# Patient Record
Sex: Female | Born: 1950 | Race: White | Hispanic: No | Marital: Married | Smoking: Never smoker
Health system: Southern US, Community
[De-identification: ages and names within clinical notes are randomized; demographics above are authoritative.]

## PROBLEM LIST (undated history)

## (undated) DIAGNOSIS — F329 Major depressive disorder, single episode, unspecified: Secondary | ICD-10-CM

## (undated) DIAGNOSIS — D219 Benign neoplasm of connective and other soft tissue, unspecified: Secondary | ICD-10-CM

## (undated) DIAGNOSIS — F32A Depression, unspecified: Secondary | ICD-10-CM

## (undated) DIAGNOSIS — M8580 Other specified disorders of bone density and structure, unspecified site: Secondary | ICD-10-CM

## (undated) HISTORY — PX: ENDOMETRIAL ABLATION: SHX621

## (undated) HISTORY — PX: ABDOMINAL HYSTERECTOMY: SHX81

## (undated) HISTORY — PX: TONSILLECTOMY AND ADENOIDECTOMY: SHX28

## (undated) HISTORY — DX: Other specified disorders of bone density and structure, unspecified site: M85.80

## (undated) HISTORY — DX: Major depressive disorder, single episode, unspecified: F32.9

## (undated) HISTORY — DX: Benign neoplasm of connective and other soft tissue, unspecified: D21.9

## (undated) HISTORY — DX: Depression, unspecified: F32.A

---

## 2006-08-21 ENCOUNTER — Ambulatory Visit: Payer: Self-pay | Admitting: Gastroenterology

## 2007-05-14 ENCOUNTER — Emergency Department: Payer: Self-pay | Admitting: Emergency Medicine

## 2007-05-17 ENCOUNTER — Ambulatory Visit: Payer: Self-pay

## 2009-09-30 ENCOUNTER — Other Ambulatory Visit: Admission: RE | Admit: 2009-09-30 | Discharge: 2009-09-30 | Payer: Self-pay | Admitting: Gynecology

## 2009-09-30 ENCOUNTER — Ambulatory Visit: Payer: Self-pay | Admitting: Gynecology

## 2009-10-19 ENCOUNTER — Encounter: Admission: RE | Admit: 2009-10-19 | Discharge: 2009-10-19 | Payer: Self-pay | Admitting: Gynecology

## 2009-10-19 ENCOUNTER — Ambulatory Visit: Payer: Self-pay | Admitting: Gynecology

## 2009-10-28 ENCOUNTER — Encounter: Admission: RE | Admit: 2009-10-28 | Discharge: 2009-10-28 | Payer: Self-pay | Admitting: Gynecology

## 2009-12-02 ENCOUNTER — Ambulatory Visit: Payer: Self-pay | Admitting: Gynecology

## 2009-12-11 ENCOUNTER — Ambulatory Visit: Payer: Self-pay | Admitting: Gynecology

## 2009-12-18 ENCOUNTER — Ambulatory Visit: Payer: Self-pay | Admitting: Gynecology

## 2010-02-10 ENCOUNTER — Ambulatory Visit: Payer: Self-pay | Admitting: Gynecology

## 2010-03-16 ENCOUNTER — Ambulatory Visit: Payer: Self-pay | Admitting: Gynecology

## 2010-05-10 ENCOUNTER — Encounter: Admission: RE | Admit: 2010-05-10 | Discharge: 2010-05-10 | Payer: Self-pay | Admitting: Gynecology

## 2010-10-03 IMAGING — MG MM DIGITAL SCREENING BILAT W/ CAD
4 series · 4 of 4 positions shown · non-contrast
Comparison: none

DG SCREEN MAMMOGRAM BILATERAL
Bilateral CC and MLO view(s) were taken.

DIGITAL SCREENING MAMMOGRAM WITH CAD:
There are scattered fibroglandular densities.  A possible mass is noted in the right breast.  Spot 
compression views and possibly sonography are recommended for further evaluation.  In the left 
breast, no masses or malignant type calcifications are identified.  Compared with prior studies 
from [REDACTED] dated 10-06-08, 01-08-08 and 01-04-06.
Images were processed with CAD.

[R CC]
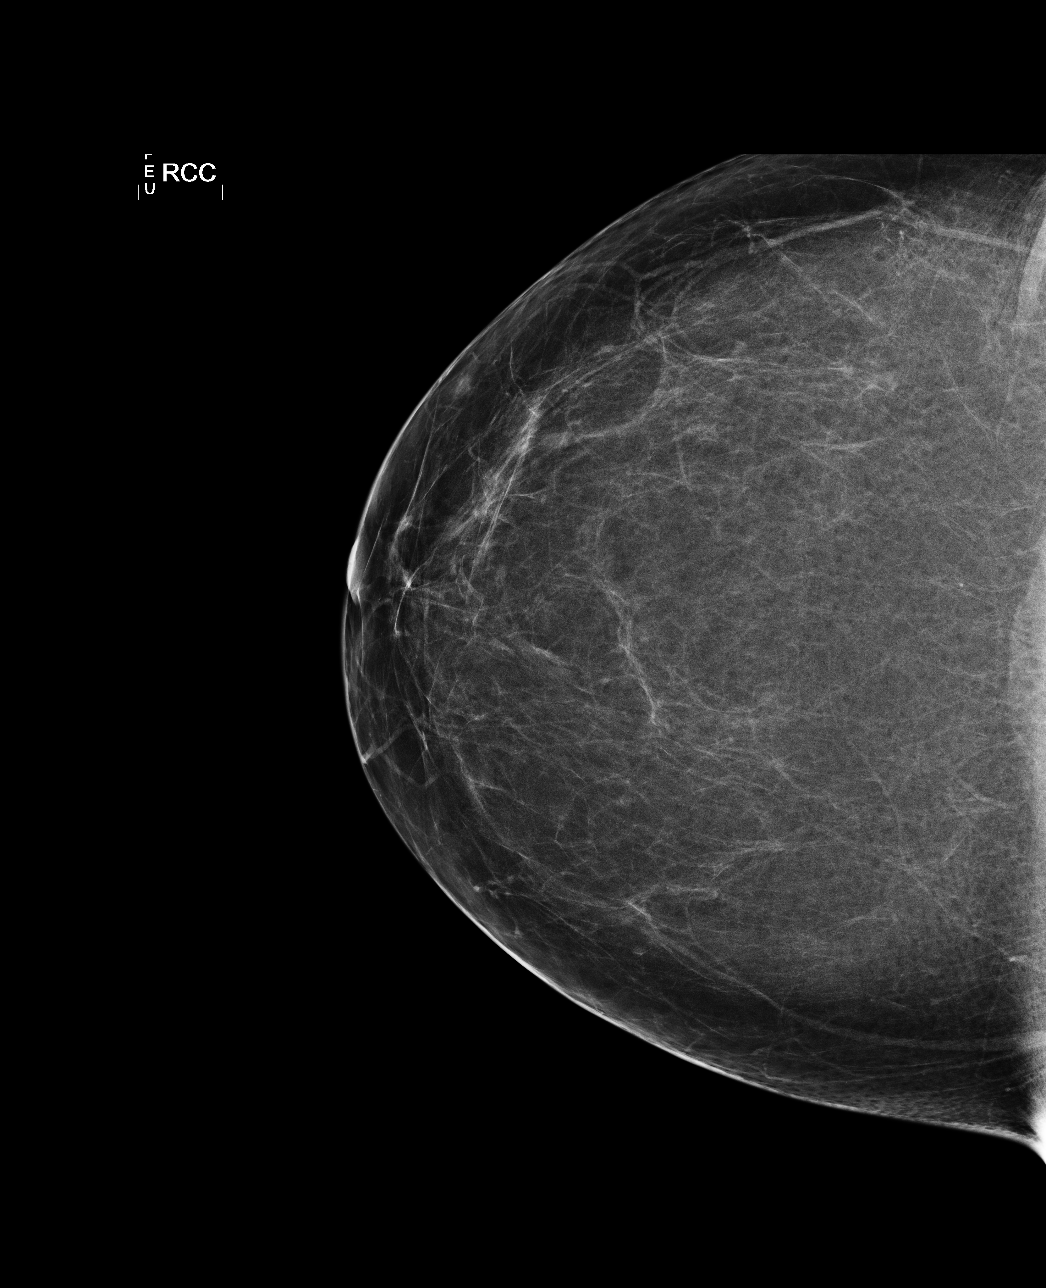

[L CC]
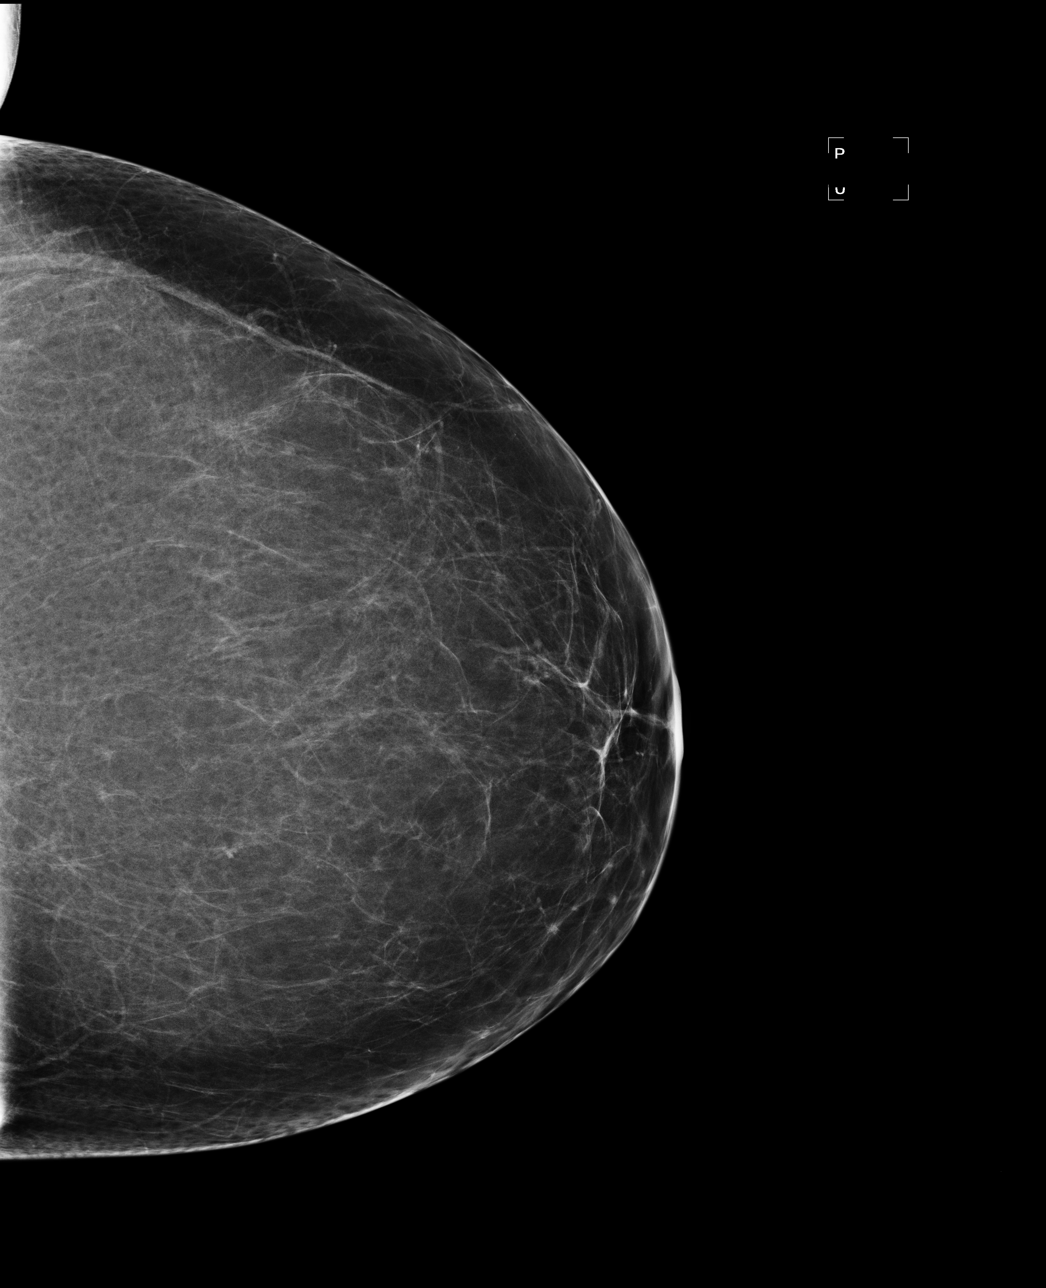

[L MLO]
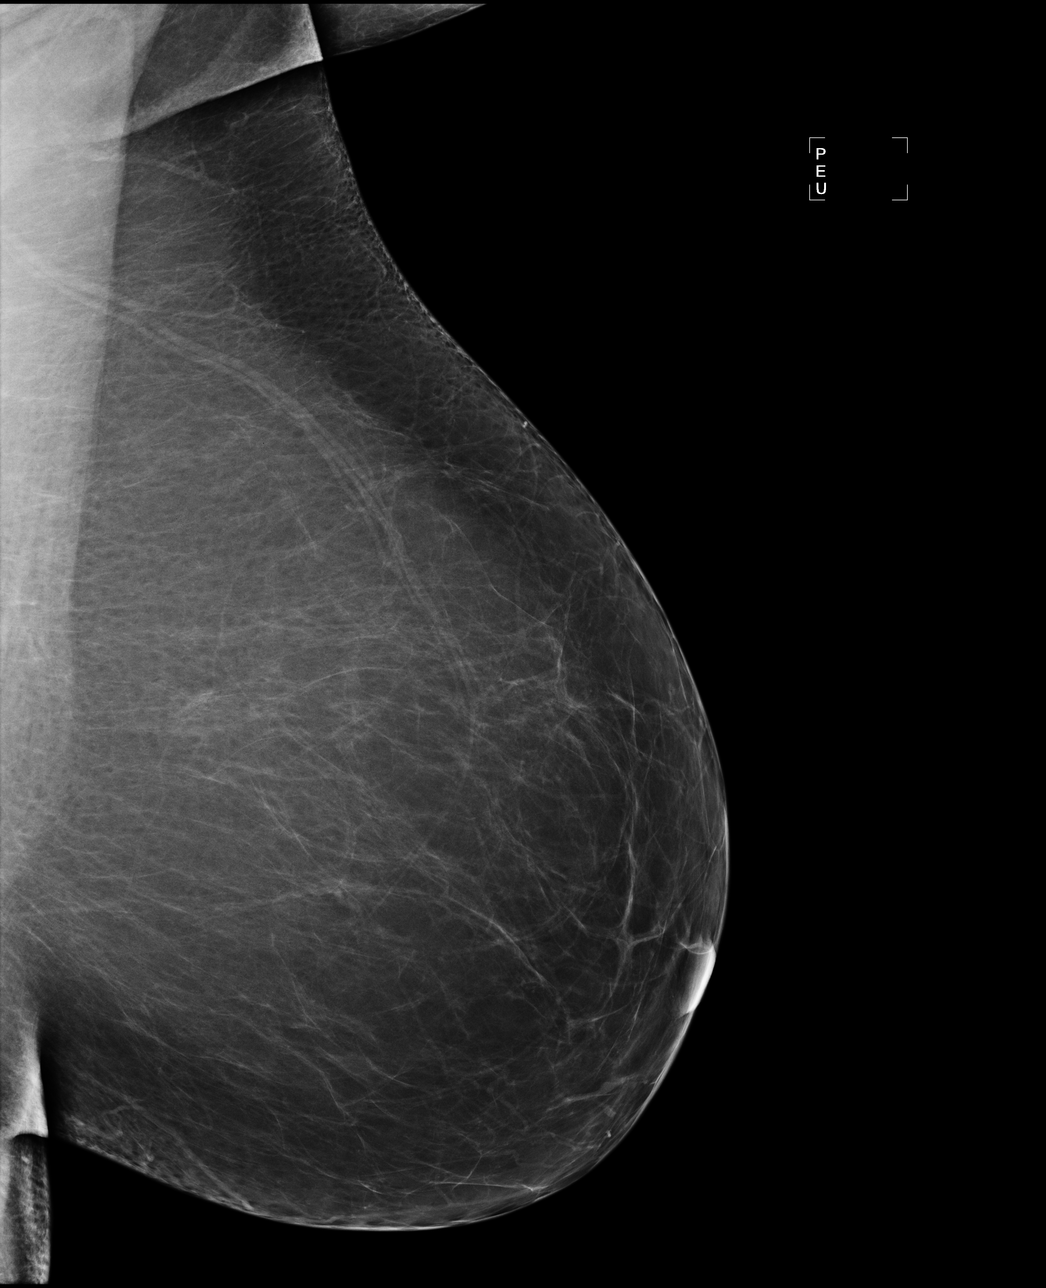

[R MLO]
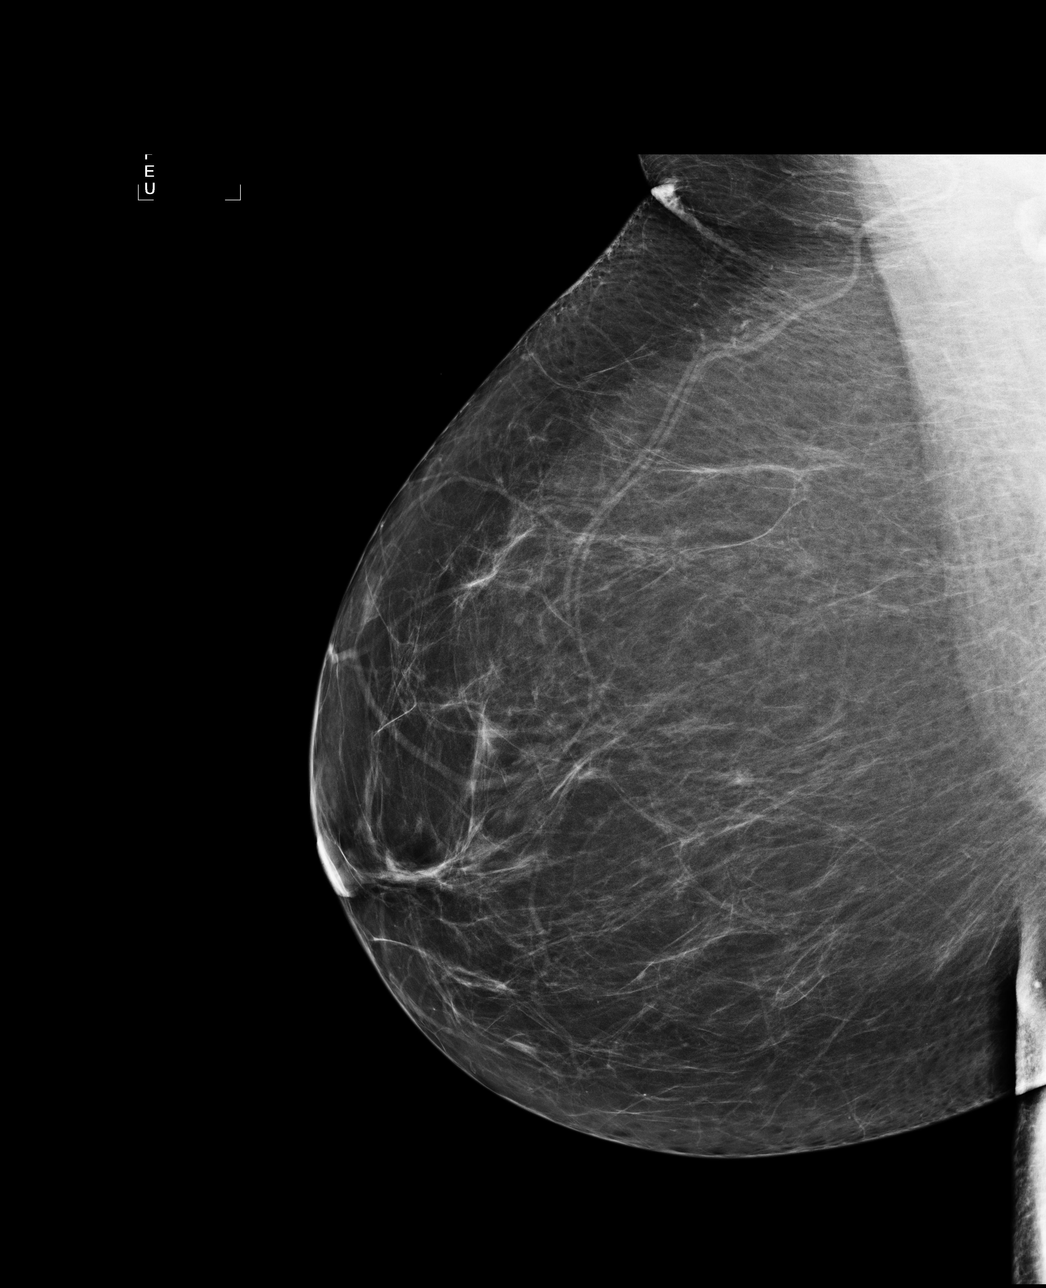

[4 of 4 positions shown; findings below may reference images not displayed]

IMPRESSION: Possible mass, right breast.  Additional evaluation is indicated.  The patient will be contacted 
for additional studies and a supplementary report will follow.  No specific mammographic evidence 
of malignancy, left breast.

ASSESSMENT: Need additional imaging evaluation and/or prior mammograms for comparison - BI-RADS 0

Further imaging of the right breast.
,

## 2010-10-06 ENCOUNTER — Other Ambulatory Visit: Payer: Self-pay | Admitting: Gynecology

## 2010-10-06 DIAGNOSIS — Z1231 Encounter for screening mammogram for malignant neoplasm of breast: Secondary | ICD-10-CM

## 2010-10-12 IMAGING — US US BREAST*R*
1 series · 8 of 8 positions shown · non-contrast
Comparison: With priors

CLINICAL DATA: Abnormal right screening mammogram

DIGITAL DIAGNOSTIC RIGHT MAMMOGRAM  AND RIGHT BREAST ULTRASOUND:

[Series 1: us breast*right* · 8 of 8 slices shown]
[im 1/8]
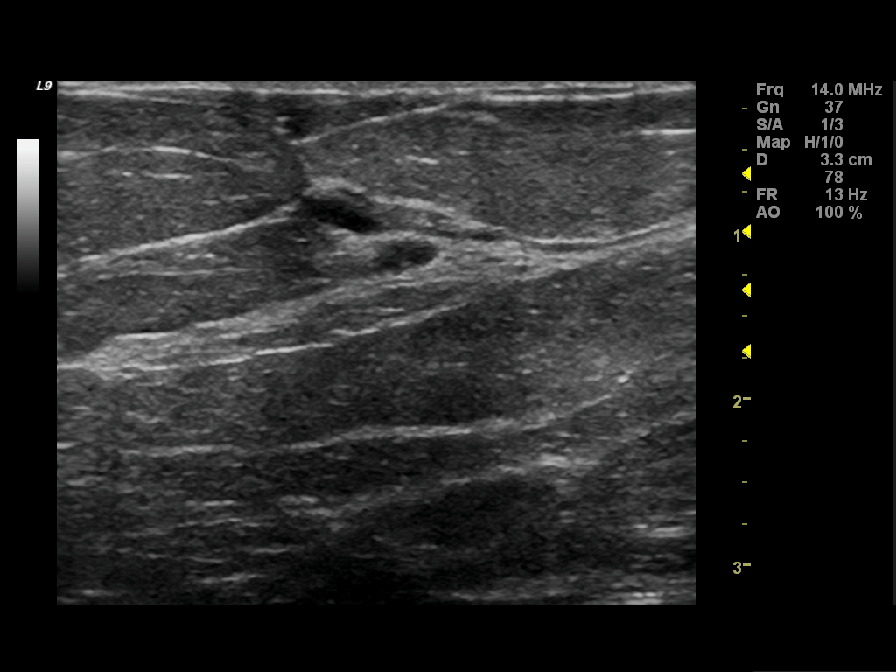
[im 2/8]
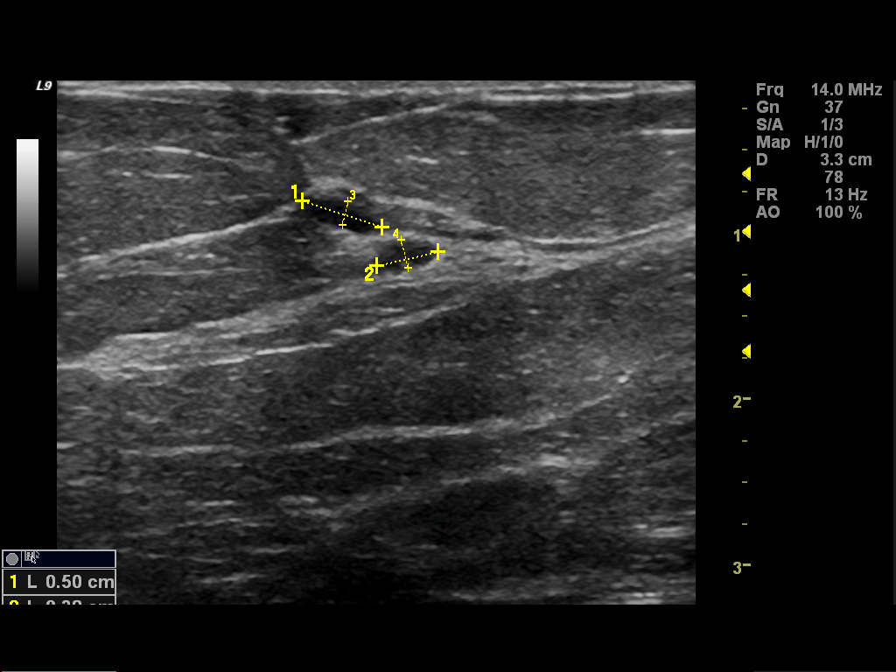
[im 3/8]
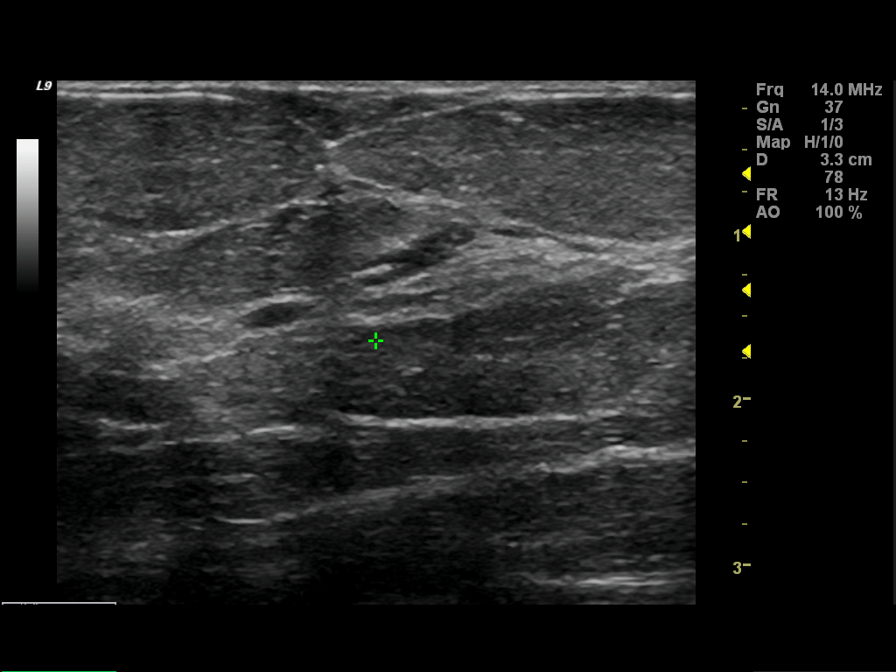
[im 4/8]
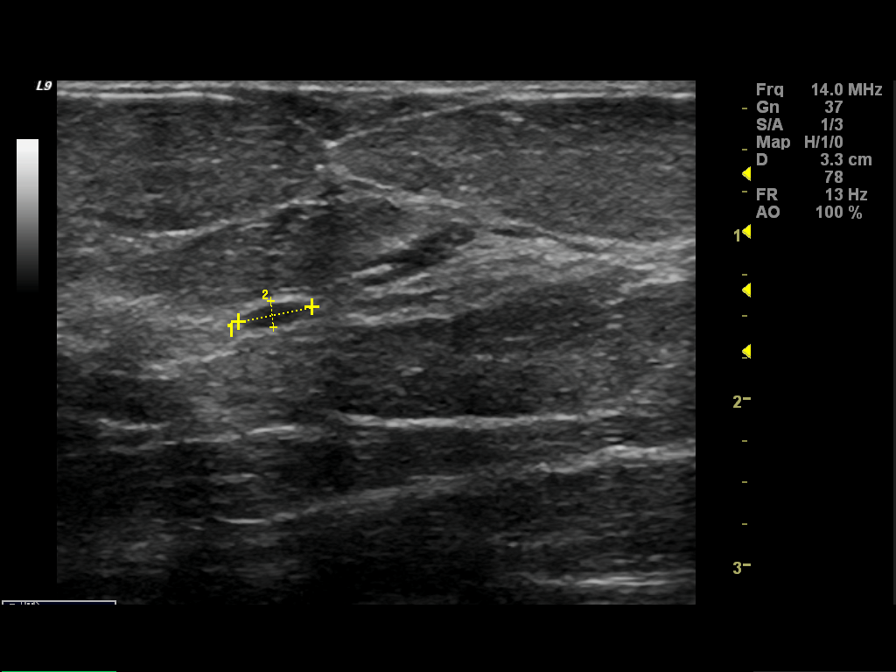
[im 5/8]
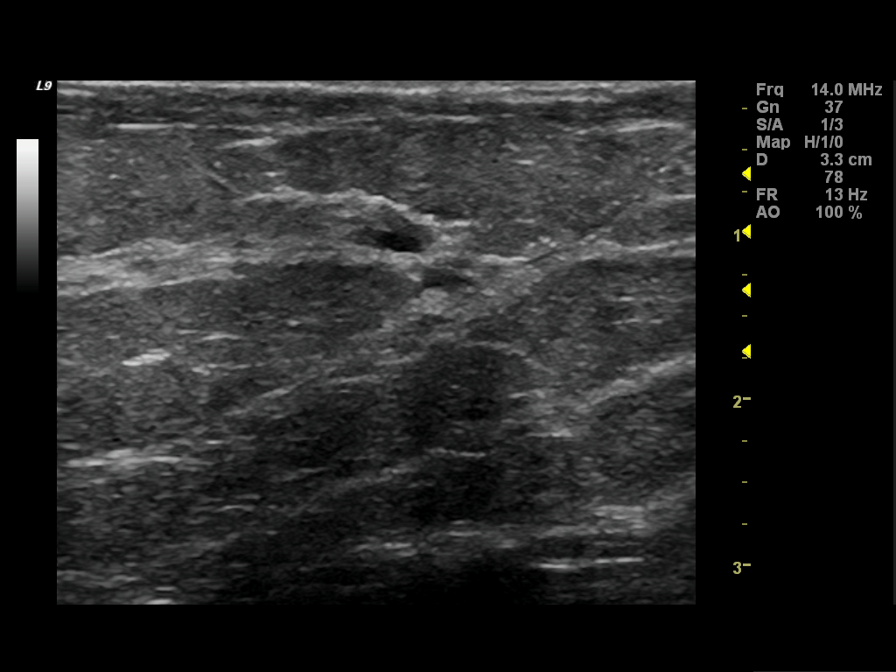
[im 6/8]
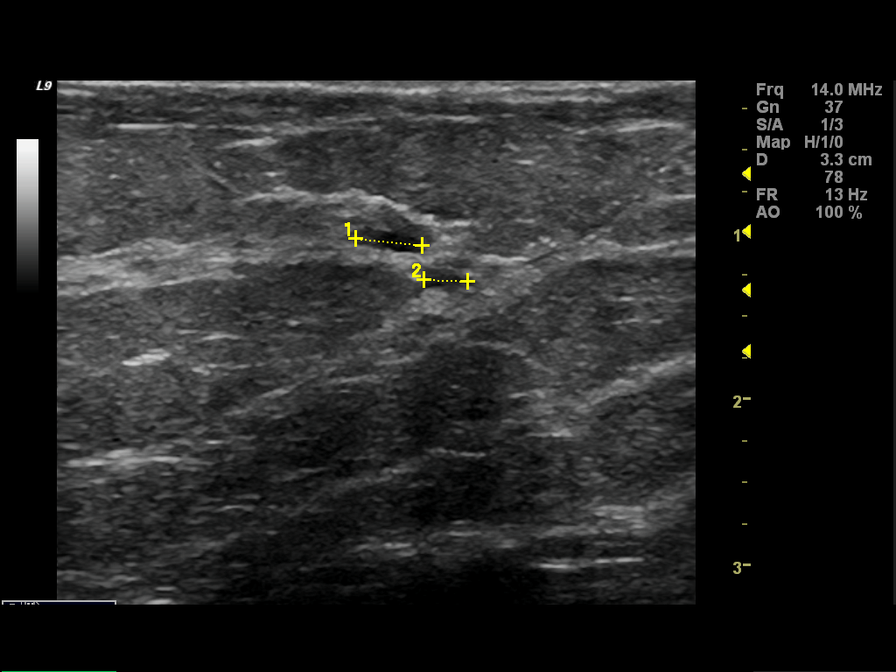
[im 7/8]
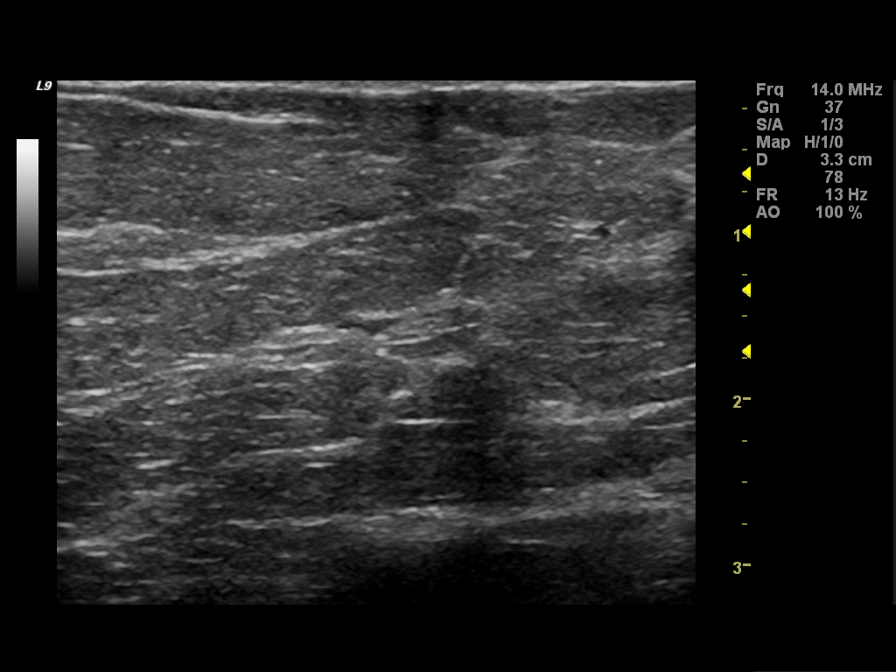
[im 8/8]
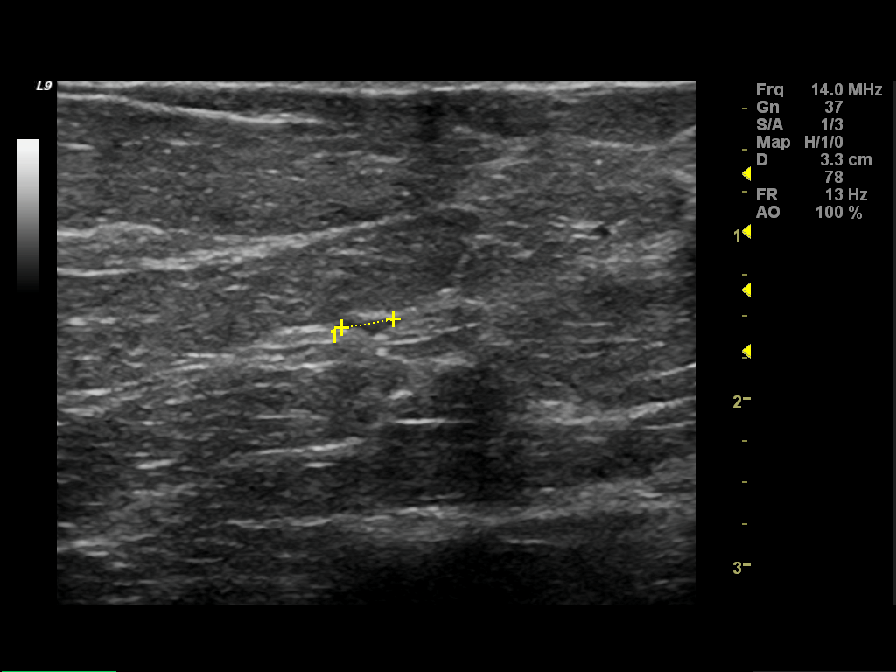

[8 of 8 positions shown; findings below may reference images not displayed]

FINDINGS: Spot compression views of the upper outer quadrant of
the right breast were performed.  There is persistence of a low
density asymmetric area.  There is no associated malignant-type
microcalcifications.

On physical exam, I do not palpate a mass in the right breast.

Ultrasound is performed, showing there are three near anechoic
lesions in the right breast at 9 o'clock 3 cm from the nipple
measuring 5 x 2 x 4 mm, 4 x 2 x 3 mm and 5 x 2 x 3 mm.  These
likely represent cysts.
IMPRESSION: Probable benign findings in the right breast.  Term interval follow-
up right mammogram and ultrasound in 6 months is recommended.

BI-RADS CATEGORY 3:  Probably benign finding(s) - short interval
follow-up suggested.

## 2010-11-01 ENCOUNTER — Ambulatory Visit
Admission: RE | Admit: 2010-11-01 | Discharge: 2010-11-01 | Disposition: A | Discharge status: Home / Self Care | Payer: 59 | Source: Ambulatory Visit | Attending: Gynecology | Admitting: Gynecology

## 2010-11-01 DIAGNOSIS — Z1231 Encounter for screening mammogram for malignant neoplasm of breast: Secondary | ICD-10-CM

## 2011-06-20 ENCOUNTER — Other Ambulatory Visit: Payer: Self-pay | Admitting: *Deleted

## 2011-06-20 NOTE — Telephone Encounter (Signed)
Patient requesting prescription for Ambien. She is overdue for her annual exam. She has not been seen in the office for her annual exam since March 2011. She will need to make an appointment before prescription can be given.

## 2011-06-21 ENCOUNTER — Other Ambulatory Visit: Payer: Self-pay | Admitting: *Deleted

## 2011-06-21 NOTE — Telephone Encounter (Signed)
Lm on patient's voicemail.  Needs annual exam before any meds given.

## 2011-06-24 ENCOUNTER — Telehealth: Payer: Self-pay | Admitting: *Deleted

## 2011-06-24 NOTE — Telephone Encounter (Signed)
Pharmacy faxed refill request over for ambein 10 mg #30 denied pt needs to make OV. Call pharmacy notify as well.

## 2011-10-25 ENCOUNTER — Encounter: Payer: Self-pay | Admitting: Gynecology

## 2011-10-25 ENCOUNTER — Ambulatory Visit (INDEPENDENT_AMBULATORY_CARE_PROVIDER_SITE_OTHER): Payer: 59 | Admitting: Gynecology

## 2011-10-25 ENCOUNTER — Other Ambulatory Visit: Payer: Self-pay | Admitting: Gynecology

## 2011-10-25 ENCOUNTER — Encounter: Payer: Self-pay | Admitting: Internal Medicine

## 2011-10-25 VITALS — BP 130/88 | Ht 62.0 in | Wt 227.0 lb

## 2011-10-25 DIAGNOSIS — Z01419 Encounter for gynecological examination (general) (routine) without abnormal findings: Secondary | ICD-10-CM

## 2011-10-25 DIAGNOSIS — Z1231 Encounter for screening mammogram for malignant neoplasm of breast: Secondary | ICD-10-CM

## 2011-10-25 DIAGNOSIS — M899 Disorder of bone, unspecified: Secondary | ICD-10-CM

## 2011-10-25 DIAGNOSIS — E559 Vitamin D deficiency, unspecified: Secondary | ICD-10-CM | POA: Insufficient documentation

## 2011-10-25 DIAGNOSIS — M8580 Other specified disorders of bone density and structure, unspecified site: Secondary | ICD-10-CM | POA: Insufficient documentation

## 2011-10-25 DIAGNOSIS — R635 Abnormal weight gain: Secondary | ICD-10-CM | POA: Insufficient documentation

## 2011-10-25 NOTE — Progress Notes (Signed)
Antonette Hendricks June 03, 1951 [id number]   History:    61 y.o.  for annual exam who has not been seen in the office in 2 years. She's not being followed by another physician at the present time. She has had issues with weight throughout the years. Review of her record indicated that she is due for her mammogram, the last one was in 2012 which was normal. She states she does her monthly self breast examination. Also was noted that her last colonoscopy was in 2006 with benign colonic polyps reported according to the patient. This was done in [facility] and she has not had a followup. Her last bone density study was in 2007 and a lowest T score was - her 0.7 at the left femoral neck (decreased bone mineralization Noralee Stain). Patient with prior history in [place] of a total abdominal hysterectomy with bilateral salpingo-oophorectomy for fibroids. Patient was in her 42s and she was on Prempro for short time but discontinued as a result of a rash. She denies any vasomotor symptoms.  Past medical history,surgical history, family history and social history were all reviewed and documented in the EPIC chart.  Gynecologic History No LMP recorded. Patient has had a hysterectomy. Contraception: none Last Pap: 2011. Results were: normal Last mammogram: 2012. Results were: normal  Obstetric History OB History    Grav Para Term Preterm Abortions TAB SAB Ect Mult Living   14 2 2       2      # Outc Date GA Lbr Len/2nd Wgt Sex Del Anes PTL Lv   1 GRA            2 GRA            3 GRA            4 GRA            5 GRA            6 GRA            7 GRA            8 GRA            9 GRA            10 GRA            11 GRA            12 GRA            13 TRM     M CS  No Yes   14 TRM     M CS  No Yes       ROS:  Was performed and pertinent positives and negatives are included in the history.  Exam: chaperone present  BP 130/88  Ht 5\' 2"  (1.575 m)  Wt 227 lb (102.967 kg)  BMI  41.52 kg/m2  Body mass index is 41.52 kg/(m^2).  General appearance : Well developed well nourished female. No acute distress HEENT: Neck supple, trachea midline, no carotid bruits, no thyroidmegaly Lungs: Clear to auscultation, no rhonchi or wheezes, or rib retractions  Heart: Regular rate and rhythm, no murmurs or gallops Breast:Examined in sitting and supine position were symmetrical in appearance, no palpable masses or tenderness,  no skin retraction, no nipple inversion, no nipple discharge, no skin discoloration, no axillary or supraclavicular lymphadenopathy Abdomen: no palpable masses or tenderness, no rebound or guarding Extremities: no edema or skin discoloration or tenderness  Pelvic:  Bartholin,  Urethra, Skene Glands: Within normal limits             Vagina: No gross lesions or discharge, limited due to her vaginismus  Cervix: Absent Uterus absent  Adnexa  Without masses or tenderness, although limited due to her vaginismus  Anus and perineum  normal   Rectovaginal  normal sphincter tone without palpated masses or tenderness             Hemoccult fecal occult blood testing cards presented to the office for testing. She refused rectal exam. She will be referred to gastroenterologist for her colonoscopy.     Assessment/Plan:  61 y.o. female for annual exam who not been seen the office in 2 years. I've recommended she get established with an internist and have given her names. Also I've given her the name of a local gastroenterologist since she is overdue for colonoscopy. She was reminded to submit to the office the fecal occult blood testing cards. Literature information on appropriate nutrition and exercise was provided. She has had history vitamin D deficiency in the past so we will be checking her vitamin D level along with her conference metabolic panel, CBC, cholesterol, TSH, and urinalysis. A Pap smear was not done today new screening guidelines were discussed. Patient would  know prior history of abnormal Pap smears and no longer needs Pap smears.    Ok Edwards MD, 5:22 PM 10/25/2011

## 2011-10-25 NOTE — Patient Instructions (Addendum)
Control del colesterol  Los niveles de colesterol en el organismo estn determinados significativamente por su dieta. Los niveles de colesterol tambin se relacionan con la enfermedad cardaca. El material que sigue ayuda a Software engineer relacin y a Chiropractor qu puede hacer para mantener su corazn sano. No todo el colesterol es [place]. Las lipoprotenas de baja densidad (LDL) forman el colesterol "malo". El colesterol malo puede ocasionar depsitos de grasa que se acumulan en el interior de las arterias. Las lipoprotenas de alta densidad (HDL) es el colesterol "bueno". Ayuda a remover el colesterol LDL "malo" de la [place]. El colesterol es un factor de riesgo muy importante para la enfermedad cardaca. Otros factores de riesgo son la hipertensin arterial, el hbito de fumar, el estrs, la herencia y [place].  El msculo cardaco obtiene el suministro de sangre a travs de las arterias coronarias. Si su colesterol LDL ("malo") est elevado y el HDL ("bueno") es bajo, tiene un factor de riesgo para que se formen depsitos de Holiday representative en las arterias coronarias (los vasos sanguneos que suministran sangre al corazn). Esto hace que haya menos lugar para que la sangre circule. Sin la suficiente sangre y oxgeno, el msculo cardaco no puede funcionar correctamente, y usted podr sentir dolores en el pecho (angina pectoris). Cuando una arteria coronaria se cierra completamente, una parte del msculo cardaco puede morir (infarto de miocardio). CONTROL DEL COLESTEROL Cuando el profesional que lo asiste enva la sangre al laboratorio para Artist nivel de colesterol, puede realizarle tambin un perfil completo de los lpidos. Con esta prueba, se puede determinar la cantidad total de colesterol, as como los niveles de LDL y HDL. Los triglicridos son un tipo de grasa que circula en la sangre y que tambin puede utilizarse para determinar el riesgo de enfermedad  cardaca. En la siguiente tabla se establecen los nmeros ideales: Prueba: Colesterol total  Menos de 200 mg/dl.  Prueba: LDL "colesterol malo"  Menos de 100 mg/dl.   Menos de 70 mg/dl si tiene riesgo muy elevado de sufrir un ataque cardaco o muerte cardaca sbita.  Prueba: HDL "colesterol bueno"  Mujeres: Ms de 50 mg/dl.   Hombres: Ms de 40 mg/dl.  Prueba: Trigliceridos  Menos de 150 mg/dl.  CONTROL DEL COLESTEROL CON DIETA Aunque factores como el ejercicio y el estilo de vida son importantes, la "primera lnea de ataque" es la dieta. Esto se debe a que se sabe que ciertos alimentos hacen subir el colesterol y otros lo [place]. El objetivo debe ser [employer] alimentos, de modo que tengan un efecto sobre el colesterol y, an ms importante, Microbiologist las grasas saturadas y trans con otros tipos de grasas, como las monoinsaturadas y las poliinsaturadas y cidos grasos omega-3 . En promedio, una persona no debe consumir ms de 15 a 17 g de grasas saturadas por [employer]. Las grasas saturadas y trans se consideran grasas "malas", ya que elevan el colesterol LDL. Las grasas saturadas se encuentran principalmente en productos animales como carne, Miles y crema. Pero esto no significa que usted Marketing executive todas sus comidas favoritas. Actualmente, como lo muestra el cuadro que figura al final de este documento, hay sustitutos de buen sabor, bajos en grasas y en colesterol, para la mayora de los alimentos que a usted Musician. Elija aquellos alimentos alternativos que sean bajos en grasas o sin grasas. Elija cortes de carne del cuarto trasero o lomo ya que estos cortes son los que tienen menor cantidad de grasa y Oncologist. El pollo (  sin piel), el pescado, la carne de ternera, y la [place] de [place] molida son excelentes opciones. Elimine las carnes [employer] o el salami. Los [employer] o nada de grasas saturadas. Cuando consuma carne [place], carne de aves de  corral, o pescado, hgalo en porciones de 85 gramos (3 onzas). Las grasas trans tambin se llaman "aceites parcialmente hidrogenados". Son aceites manipulados cientficamente de [place] que son slidos a Publishing rights manager, tienen una larga vida y Glass blower/designer sabor y la textura de los alimentos a los que se Scientist, clinical (histocompatibility and immunogenetics). Las grasas trans se encuentran en la [place], [place], crackers y alimentos horneados.  Para hornear y cocinar, el aceite es un excelente sustituto para la [place]. Los aceites monoinsaturados tienen un beneficio particular, ya que se cree que disminuyen el colesterol LDL (colesterol malo) y elevan el HDL. Deber evitar los aceites tropicales saturados como el de coco y el de [place].  Recuerde, adems, que puede comer sin restricciones los grupos de alimentos que son naturalmente libres de grasas saturadas y Neurosurgeon trans, entre los que se incluyen el pescado, las frutas (excepto el [place]), verduras, frijoles, cereales (cebada, arroz, [place], trigo) y las pastas (sin salsas con crema)  IDENTIFIQUE LOS ALIMENTOS QUE DISMINUYEN EL COLESTEROL  Pueden disminuir el colesterol las fibras solubles que estn en las frutas, como las [place], en los vegetales como el brcoli, las patatas y las zanahorias; en las legumbres como frijoles, guisantes y Therapist, occupational; y en los cereales como la cebada. Los alimentos fortificados con fitosteroles tambin Engineer, production. Debe consumir al menos 2 g de estos alimentos a diario para Financial planner de disminucin de [place].  En el supermercado, lea las etiquetas de los envases para identificar los alimentos bajos en grasas saturadas, libres de grasas trans y bajos en [place], . Elija quesos que tengan solo de 2 a 3 g de grasa saturada por onza (28,35 g). Use una margarina que no dae el corazn, [place] de grasas trans o aceite parcialmente hidrogenado. Al comprar alimentos horneados (galletitas dulces y Gaffer) evite el aceite parcialmente  hidrogenado. Los panes y bollos debern ser de granos enteros (harina de maz o de avena entera, en lugar de "harina" o "harina enriquecida"). Compre sopas en lata que no sean cremosas, con bajo contenido de sal y sin grasas adicionadas.  TCNICAS DE PREPARACIN DE LOS ALIMENTOS  Nunca fra los alimentos en aceite abundante. Si debe frer, hgalo en poco aceite y removiendo [place], porque as se utilizan muy pocas grasas, o utilice un spray antiadherente. Cuando le sea posible, hierva, hornee o ase las carnes y cocine los vegetales al vapor. En vez de [employer] con mantequilla o [place], utilice limn y hierbas, pur de Psychologist, educational y canela (para las calabazas y batatas), yogurt y salsa descremados y aderezos para ensaladas bajos en contenido graso.  BAJO EN GRASAS SATURADAS / SUSTITUTOS BAJOS EN GRASA  Carnes / Grasas saturadas (g)  Evite: Bife, corte graso (3 oz/85 g) / 11 g   Elija: Bife, corte magro (3 oz/85 g) / 4 g   Evite: Hamburg[place]a (3 oz/85 g) / 7 g   Elija:  [place] magra (3 oz/85 g) / 5 g   Evite: Jamn (3 oz/85 g) / 6 g   Elija:  Jamn magro (3 oz/85 g) / 2.4 g   Evite: Pollo, con piel (3 oz/85 g), Carne oscura / 4 g   Elija:  Pollo, sin piel (3 oz/85 g), Carne oscura / 2  g   Evite: Pollo, con piel (3 oz/85 g), Carne magra / 2.5 g   Elija: Pollo, sin piel (3 oz/85 g), Carne magra / 1 g  Lcteos / Grasas saturadas (g)  Evite: Leche entera (1 taza) / 5 g   Elija: Leche con bajo contenido de grasa, 2% (1 taza) / 3 g   Elija: Leche con bajo contenido de grasa, 1% (1 taza) / 1.5 g   Elija: Leche descremada (1 taza) / 0.3 g   Evite: Queso duro (1 oz/28 g) / 6 g   Elija: Queso descremado (1 oz/28 g) / 2-3 g   Evite: Queso cottage, 4% grasa (1 taza)/ 6.5 g   Elija: Queso cottage con bajo contenido de grasa, 1% grasa (1 taza)/ 1.5 g   Evite: Helado (1 taza) / 9 g   Elija: Sorbete (1 taza) / 2.5 g   Elija: Yogurt helado sin contenido de grasa  (1 taza) / 0.3 g   Elija: Barras de fruta congeladas / vestigios   Evite: Crema batida (1 cucharada) / 3.5 g   Elija: Batidos glac sin lcteos (1 cucharada) / 1 g  Condimentos / Grasas saturadas (g)  Evite: Mayonesa (1 cucharada) / 2 g   Elija: Mayonesa con bajo contenido de grasa (1 cucharada) / 1 g   Evite: [place] (1 cucharada) / 7 g   Elija: Margarina extra light (1 cucharada) / 1 g   Evite: Aceite de coco (1 cucharada) / 11.8 g   Elija: Aceite de oliva (1 cucharada) / 1.8 g   Elija: Aceite de maz (1 cucharada) / 1.7 g   Elija: Aceite de crtamo (1 cucharada) / 1.2 g   Elija: Aceite de girasol (1 cucharada) / 1.4 g   Elija: Aceite de soja (1 cucharada) / 2.4 g   Elija: Aceite de canola (1 cucharada) / 1 g  Document Released: 07/04/2005 Document Revised: 03/16/2011 [facility] Patient Information 2012 [place], [place].  Ejercicios para perder peso (Exercise to Lose Weight) La actividad fsica y [place] Dear dieta saludable ayudan a perder peso. El mdico podr sugerirle ejercicios especficos. IDEAS Y CONSEJOS PARA HACER EJERCICIOS  Elija opciones econmicas que disfrute hacer , como caminar, andar en bicicleta o los vdeos para ejercitarse.   Utilice las Microbiologist del ascensor.   Camine durante la hora del almuerzo.   Estacione el auto lejos del lugar de [place] o [place].   Concurra a un gimnasio o tome clases de gimnasia.   Comience con 5  10 minutos de actividad fsica por da. Ejercite hasta 30 minutos, 4 a 6 das por [address]   Utilice zapatos que tengan un buen soporte y ropas cmodas.   Elongue antes y despus de Company secretary.   Ejercite hasta que aumente la respiracin y el corazn palpite rpido.   Beba agua extra cuando ejercite.   No haga ejercicio Firefighter, sentirse mareado o que le falte mucho el aire.  La actividad fsica puede quemar alrededor de 150 caloras.  Correr 20 cuadras en 15 minutos.   Jugar vley durante 45 a 60 minutos.     Limpiar y encerar el auto durante 45 a 60 minutos.   Jugar ftbol americano de toque.   Caminar 25 cuadras en 35 minutos.   Empujar un cochecito 20 cuadras en 30 minutos.   Jugar baloncesto durante 30 minutos.   Rastrillar hojas secas durante 30 minutos.   Andar en bicicleta 80 cuadras en 30 minutos.   Caminar 30 cuadras en  30 minutos.   Bailar durante 30 minutos.   Quitar la nieve con una pala durante 15 minutos.   Nadar vigorosamente durante 20 minutos.   Subir escaleras durante 15 minutos.   Andar en bicicleta 60 cuadras durante 15 minutos.   Arreglar el jardn entre 30 y 45 minutos.   Saltar a la soga durante 15 minutos.   Limpiar vidrios o pisos durante 45 a 60 minutos.  Document Released: 10/08/2010 Document Revised: 03/16/2011 [facility] Patient Information 2012 [place], [place].

## 2011-10-26 LAB — COMPREHENSIVE METABOLIC PANEL
BUN: 19 mg/dL (ref 6–23)
CO2: 20 mEq/L (ref 19–32)
Glucose, Bld: 93 mg/dL (ref 70–99)
Sodium: 135 mEq/L (ref 135–145)
Total Bilirubin: 0.3 mg/dL (ref 0.3–1.2)
Total Protein: 6.9 g/dL (ref 6.0–8.3)

## 2011-10-26 LAB — URINALYSIS W MICROSCOPIC + REFLEX CULTURE
Protein, ur: NEGATIVE mg/dL
Urobilinogen, UA: 0.2 mg/dL (ref 0.0–1.0)

## 2011-10-26 LAB — CBC WITH DIFFERENTIAL/PLATELET
Eosinophils Relative: 2 % (ref 0–5)
Hemoglobin: 14.2 g/dL (ref 12.0–15.0)
Lymphocytes Relative: 25 % (ref 12–46)
Lymphs Abs: 2 10*3/uL (ref 0.7–4.0)
MCV: 94.1 fL (ref 78.0–100.0)
Monocytes Relative: 5 % (ref 3–12)
Neutrophils Relative %: 67 % (ref 43–77)
Platelets: 343 10*3/uL (ref 150–400)
RBC: 4.71 MIL/uL (ref 3.87–5.11)
WBC: 7.7 10*3/uL (ref 4.0–10.5)

## 2011-10-26 LAB — TSH: TSH: 1.592 u[IU]/mL (ref 0.350–4.500)

## 2011-10-27 ENCOUNTER — Telehealth: Payer: Self-pay | Admitting: *Deleted

## 2011-10-27 LAB — URINE CULTURE: Colony Count: 9000

## 2011-10-27 MED ORDER — ZOLPIDEM TARTRATE 10 MG PO TABS: 10.0000 mg | ORAL_TABLET | Freq: Every evening | ORAL | Status: DC | PRN

## 2011-10-27 NOTE — Telephone Encounter (Signed)
PT CALLED REQUESTING REFILL ON AMBIEN 10 MG, RX CALLED IN, PT IS UP TO DATE ON HER ANNUAL # 30 WITH NO REFILLS.

## 2011-10-28 ENCOUNTER — Other Ambulatory Visit: Payer: Self-pay | Admitting: *Deleted

## 2011-10-28 DIAGNOSIS — E7800 Pure hypercholesterolemia, unspecified: Secondary | ICD-10-CM

## 2011-10-31 ENCOUNTER — Emergency Department: Payer: Self-pay | Admitting: Emergency Medicine

## 2011-10-31 LAB — CK TOTAL AND CKMB (NOT AT ARMC)
CK, Total: 83 U/L (ref 21–215)
CK-MB: 0.6 ng/mL (ref 0.5–3.6)

## 2011-10-31 LAB — COMPREHENSIVE METABOLIC PANEL
Alkaline Phosphatase: 129 U/L (ref 50–136)
Anion Gap: 13 (ref 7–16)
Bilirubin,Total: 0.5 mg/dL (ref 0.2–1.0)
Calcium, Total: 9.8 mg/dL (ref 8.5–10.1)
Creatinine: 0.92 mg/dL (ref 0.60–1.30)
Osmolality: 287 (ref 275–301)
Potassium: 4 mmol/L (ref 3.5–5.1)
SGOT(AST): 27 U/L (ref 15–37)
SGPT (ALT): 29 U/L
Sodium: 143 mmol/L (ref 136–145)
Total Protein: 8.6 g/dL — ABNORMAL HIGH (ref 6.4–8.2)

## 2011-11-01 LAB — CK TOTAL AND CKMB (NOT AT ARMC)
CK, Total: 64 U/L (ref 21–215)
CK-MB: 0.5 ng/mL (ref 0.5–3.6)

## 2011-11-01 LAB — CBC
MCH: 30.2 pg (ref 26.0–34.0)
MCHC: 32.6 g/dL (ref 32.0–36.0)
Platelet: 259 10*3/uL (ref 150–440)
WBC: 4.8 10*3/uL (ref 3.6–11.0)

## 2011-11-01 LAB — PROTIME-INR: Prothrombin Time: 13.6 secs (ref 11.5–14.7)

## 2011-11-01 LAB — TROPONIN I: Troponin-I: <0.02 ng/mL

## 2011-11-08 DIAGNOSIS — Z1211 Encounter for screening for malignant neoplasm of colon: Secondary | ICD-10-CM

## 2011-11-09 ENCOUNTER — Other Ambulatory Visit: Payer: Self-pay | Admitting: *Deleted

## 2011-11-09 ENCOUNTER — Other Ambulatory Visit: Payer: Self-pay | Admitting: Gynecology

## 2011-11-09 DIAGNOSIS — Z1211 Encounter for screening for malignant neoplasm of colon: Secondary | ICD-10-CM

## 2011-11-14 ENCOUNTER — Ambulatory Visit: Payer: 59 | Admitting: Internal Medicine

## 2011-11-14 DIAGNOSIS — Z0289 Encounter for other administrative examinations: Secondary | ICD-10-CM

## 2011-11-15 ENCOUNTER — Inpatient Hospital Stay: Admission: RE | Admit: 2011-11-15 | Payer: 59 | Source: Ambulatory Visit

## 2011-11-21 ENCOUNTER — Other Ambulatory Visit: Payer: Self-pay | Admitting: *Deleted

## 2011-11-21 MED ORDER — ZOLPIDEM TARTRATE 10 MG PO TABS: 10.0000 mg | ORAL_TABLET | Freq: Every evening | ORAL | Status: DC | PRN

## 2011-11-21 NOTE — Telephone Encounter (Signed)
Please find who prescribed her Ambien I reviewed under epic and I did not prescribed it to her before?

## 2011-11-21 NOTE — Telephone Encounter (Signed)
rx called in.  Patient was prescribed last month.

## 2011-12-07 ENCOUNTER — Ambulatory Visit: Payer: 59

## 2011-12-16 ENCOUNTER — Encounter: Payer: 59 | Admitting: Internal Medicine

## 2012-09-25 DIAGNOSIS — G4700 Insomnia, unspecified: Secondary | ICD-10-CM | POA: Insufficient documentation

## 2012-10-02 ENCOUNTER — Other Ambulatory Visit: Payer: Self-pay

## 2012-10-02 DIAGNOSIS — Z1231 Encounter for screening mammogram for malignant neoplasm of breast: Secondary | ICD-10-CM

## 2012-10-30 ENCOUNTER — Ambulatory Visit
Admission: RE | Admit: 2012-10-30 | Discharge: 2012-10-30 | Disposition: A | Discharge status: Home / Self Care | Payer: 59 | Source: Ambulatory Visit

## 2012-10-30 DIAGNOSIS — Z1231 Encounter for screening mammogram for malignant neoplasm of breast: Secondary | ICD-10-CM

## 2012-10-31 ENCOUNTER — Ambulatory Visit: Payer: 59

## 2012-12-13 DIAGNOSIS — M1710 Unilateral primary osteoarthritis, unspecified knee: Secondary | ICD-10-CM | POA: Insufficient documentation

## 2013-09-25 ENCOUNTER — Other Ambulatory Visit: Payer: Self-pay

## 2013-09-25 DIAGNOSIS — Z1231 Encounter for screening mammogram for malignant neoplasm of breast: Secondary | ICD-10-CM

## 2013-10-31 ENCOUNTER — Ambulatory Visit
Admission: RE | Admit: 2013-10-31 | Discharge: 2013-10-31 | Disposition: A | Discharge status: Home / Self Care | Payer: 59 | Source: Ambulatory Visit

## 2013-10-31 DIAGNOSIS — Z1231 Encounter for screening mammogram for malignant neoplasm of breast: Secondary | ICD-10-CM

## 2014-05-19 ENCOUNTER — Encounter: Payer: Self-pay | Admitting: Gynecology

## 2014-08-01 ENCOUNTER — Ambulatory Visit: Payer: Self-pay | Admitting: Obstetrics and Gynecology

## 2014-10-31 ENCOUNTER — Other Ambulatory Visit: Payer: Self-pay

## 2014-10-31 DIAGNOSIS — Z1231 Encounter for screening mammogram for malignant neoplasm of breast: Secondary | ICD-10-CM

## 2014-11-19 ENCOUNTER — Encounter (INDEPENDENT_AMBULATORY_CARE_PROVIDER_SITE_OTHER): Payer: Self-pay

## 2014-11-19 ENCOUNTER — Ambulatory Visit
Admission: RE | Admit: 2014-11-19 | Discharge: 2014-11-19 | Disposition: A | Discharge status: Home / Self Care | Payer: 59 | Source: Ambulatory Visit

## 2014-11-19 DIAGNOSIS — Z1231 Encounter for screening mammogram for malignant neoplasm of breast: Secondary | ICD-10-CM

## 2014-11-21 ENCOUNTER — Ambulatory Visit (INDEPENDENT_AMBULATORY_CARE_PROVIDER_SITE_OTHER): Payer: 59 | Admitting: Certified Nurse Midwife

## 2014-11-21 ENCOUNTER — Encounter: Payer: Self-pay | Admitting: Certified Nurse Midwife

## 2014-11-21 VITALS — BP 132/74 | HR 94 | Resp 20 | Ht 62.25 in | Wt 252.2 lb

## 2014-11-21 DIAGNOSIS — Z01419 Encounter for gynecological examination (general) (routine) without abnormal findings: Secondary | ICD-10-CM

## 2014-11-21 DIAGNOSIS — Z0000 Encounter for general adult medical examination without abnormal findings: Secondary | ICD-10-CM | POA: Diagnosis not present

## 2014-11-21 DIAGNOSIS — L309 Dermatitis, unspecified: Secondary | ICD-10-CM

## 2014-11-21 LAB — POCT URINALYSIS DIPSTICK
Leukocytes, UA: NEGATIVE
PH UA: 5
Urobilinogen, UA: NEGATIVE

## 2014-11-21 NOTE — Patient Instructions (Signed)

## 2014-11-21 NOTE — Progress Notes (Signed)
64 y.o. G7 P2052 Married  Caucasian Fe Basque here to establish gyn care and  for annual exam. Menopausal no HRT. History of fibroids with TAH,BSO. Patient has not had exam in several years. Patient had mammogram 11/20/14 negative. Patient also complaining of external vaginal itching for the past year. Has tried caladryl with relief, no lesions or bumps felt. Also complaining of breast sensitivity with bra touching only. Has resolved. Patient changed bras with relief. Desires screening labs. No other health issues today. Slight language comprehension with questions only.  No LMP recorded. Patient has had a hysterectomy.          Sexually active: No.  The current method of family planning is status post hysterectomy.    Exercising: No.  The patient does not participate in regular exercise at present. Smoker:  no  Health Maintenance: Pap:  3 Years ago wnl MMG:  11/19/14 Bi-Rads 1: Negative  SBE:  no Colonoscopy:  5 years ago polyp f/u in 10 years  2021 BMD:  12/07/11 Normal  TDaP:  2010  Labs: Hgb: 14.6 ; Urine: Negative   reports that she has never smoked. She has never used smokeless tobacco. She reports that she drinks alcohol. She reports that she does not use illicit drugs.  Past Medical History  Diagnosis Date  . Osteopenia   . Vitamin D deficiency     Past Surgical History  Procedure Laterality Date  . Tonsillectomy and adenoidectomy    . Abdominal hysterectomy  Age 70    TAH/BSO  . Cesarean section      x2  . Endometrial ablation      Current Outpatient Prescriptions  Medication Sig Dispense Refill  . zolpidem (AMBIEN) 10 MG tablet Take 1 tablet (10 mg total) by mouth at bedtime as needed for sleep. 30 tablet 2   No current facility-administered medications for this visit.    Family History  Problem Relation Age of Onset  . Cancer Father     ROS:  Pertinent items are noted in HPI.  Otherwise, a comprehensive ROS was negative.  Exam:   BP 132/74 mmHg  Pulse 94   Resp 20  Ht 5' 2.25" (1.581 m)  Wt 252 lb 3.2 oz (114.397 kg)  BMI 45.77 kg/m2 Height: 5' 2.25" (158.1 cm) Ht Readings from Last 3 Encounters:  11/21/14 5' 2.25" (1.581 m)  10/25/11 5\' 2"  (1.575 m)    General appearance: alert, cooperative and appears stated age Head: Normocephalic, without obvious abnormality, atraumatic Neck: no adenopathy, supple, symmetrical, trachea midline and thyroid normal to inspection and palpation Lungs: clear to auscultation bilaterally Breasts: normal appearance, no masses or tenderness, No nipple retraction or dimpling, No nipple discharge or bleeding, No axillary or supraclavicular adenopathy Heart: regular rate and rhythm Abdomen: soft, non-tender; no masses,  no organomegaly Extremities: extremities normal, atraumatic, no cyanosis or edema Skin: Skin color, texture, turgor normal. No rashes or lesions Lymph nodes: Cervical, supraclavicular, and axillary nodes normal. No abnormal inguinal nodes palpated Neurologic: Grossly normal   Pelvic: External genitalia:  no lesions              Urethra:  normal appearing urethra with no masses, tenderness or lesions              Bartholin's and Skene's: normal                 Vagina: normal appearing vagina with normal color and discharge, no lesions, slight odor Affirm taken  Cervix: absent              Pap taken: No. Bimanual Exam:  Uterus:  normal size, contour, position, consistency, mobility, non-tender              Adnexa: normal adnexa and no mass, fullness, tenderness               Rectovaginal: Confirms               Anus:  normal sphincter tone, no lesions. Loss of pigmentation with slight lace like pattern ? LS, but excoriated also from scratching. Wet prep taken with positive for yeast.  Chaperone present: Yes  A:  Well Woman with normal exam  Menopausal  No HRT s/p TAH BSO fibroids  History of vitamin D deficiency  ? LS at anal area vs yeast excoriation  Screening labs  requested  P:   Reviewed health and wellness pertinent to exam  Discussed anal area findings and change in pigmentation, shown to patient in mirror, she relates she has had this for a long time, slight lace like pattern, but excoriated also. Yeast noted and shared with patient.  Rx Mycolog cream see order, recheck in two weeks if affirm negative, will wait on results to schedule  Labs:Lipid panel, CMP, Vit.D, TSH  Pap smear not taken today   counseled on breast self exam, mammography screening, menopause, adequate intake of calcium and vitamin D, diet and exercise  return annually or prn  An After Visit Summary was printed and given to the patient.

## 2014-11-22 LAB — COMPREHENSIVE METABOLIC PANEL
ALBUMIN: 4.1 g/dL (ref 3.5–5.2)
ALT: 20 U/L (ref 0–35)
AST: 17 U/L (ref 0–37)
Alkaline Phosphatase: 120 U/L — ABNORMAL HIGH (ref 39–117)
BUN: 11 mg/dL (ref 6–23)
CALCIUM: 10.5 mg/dL (ref 8.4–10.5)
CHLORIDE: 100 meq/L (ref 96–112)
CO2: 27 mEq/L (ref 19–32)
Creat: 0.91 mg/dL (ref 0.50–1.10)
GLUCOSE: 83 mg/dL (ref 70–99)
POTASSIUM: 4.4 meq/L (ref 3.5–5.3)
SODIUM: 139 meq/L (ref 135–145)
TOTAL PROTEIN: 6.9 g/dL (ref 6.0–8.3)
Total Bilirubin: 0.4 mg/dL (ref 0.2–1.2)

## 2014-11-22 LAB — VITAMIN D 25 HYDROXY (VIT D DEFICIENCY, FRACTURES): Vit D, 25-Hydroxy: 13 ng/mL — ABNORMAL LOW (ref 30–100)

## 2014-11-22 LAB — LIPID PANEL
CHOL/HDL RATIO: 3.2 ratio
Cholesterol: 204 mg/dL — ABNORMAL HIGH (ref 0–200)
HDL: 63 mg/dL (ref >=46)
LDL Cholesterol: 103 mg/dL — ABNORMAL HIGH (ref 0–99)
Triglycerides: 192 mg/dL — ABNORMAL HIGH (ref <150)
VLDL: 38 mg/dL (ref 0–40)

## 2014-11-22 LAB — WET PREP BY MOLECULAR PROBE
Candida species: NEGATIVE
Gardnerella vaginalis: NEGATIVE
TRICHOMONAS VAG: NEGATIVE

## 2014-11-22 LAB — TSH: TSH: 4.812 u[IU]/mL — AB (ref 0.350–4.500)

## 2014-11-22 NOTE — Progress Notes (Signed)
Consider vulvar biopsy if Affirm testing negative.  Reviewed personally.  Felipa Emory, MD.

## 2014-11-24 ENCOUNTER — Other Ambulatory Visit: Payer: Self-pay | Admitting: Certified Nurse Midwife

## 2014-11-24 DIAGNOSIS — R899 Unspecified abnormal finding in specimens from other organs, systems and tissues: Secondary | ICD-10-CM

## 2014-11-25 ENCOUNTER — Telehealth: Payer: Self-pay | Admitting: Certified Nurse Midwife

## 2014-11-25 LAB — HEMOGLOBIN, FINGERSTICK: HEMOGLOBIN, FINGERSTICK: 14.6 g/dL (ref 12.0–16.0)

## 2014-11-25 MED ORDER — VITAMIN D (ERGOCALCIFEROL) 1.25 MG (50000 UNIT) PO CAPS: 50000.0000 [IU] | ORAL_CAPSULE | ORAL | Status: DC

## 2014-11-25 MED ORDER — NYSTATIN-TRIAMCINOLONE 100000-0.1 UNIT/GM-% EX OINT: 1.0000 "application " | TOPICAL_OINTMENT | Freq: Two times a day (BID) | CUTANEOUS | Status: AC

## 2014-11-25 NOTE — Telephone Encounter (Signed)
Left message to call [place] at [phone].  Notes Recorded by Susy Manor, CMA on 11/25/2014 at 10:02 AM Pt to take vit d 50,000iu once weekly for 68mths then recheck level Notes Recorded by Regina Eck, CNM on 11/24/2014 at 7:58 AM Notify patient that affirm was negative for vaginal yeast. Complete cream use for external area as directed. Needs to week recheck of area and needs to be when MD here. Please schedule Lipid panel is near optimal level except for Triglycerides, work on lowering intake of carbohydrates and concentrated sugar foods. Recheck in one month order in TSH is elevated can be stress oriented, needs recheck in one month order in Vitamin D very low needs protocol Kidney and glucose profile normal Liver profile normal except for alkaline phosphatase which is borderline elevation, avoid OTC medications such as Motrin, Advil, Tylenol for 2 weeks will recheck with follow up visit

## 2014-11-25 NOTE — Telephone Encounter (Signed)
Pt checking on results from [place].

## 2014-11-25 NOTE — Telephone Encounter (Signed)
Spoke with patient. Results given. Patient is agreeable and verbalizes understanding. 2 week recheck for vaginal itching and liver profile scheduled for 5/27 at 2:30pm with Dr.Miller. 1 month lab recheck for lipid panel and TSH scheduled for 6/10 at 3pm. 3 month lab recheck for Vitamin D level scheduled for 8/12 at 3pm. Patient is agreeable to all appointment dates and times. Vitamin D 50,000IU once a week for 3 months #12 0RF sent to [facility] on file. Patient states that she was not prescribed anything externally for her itching. Per OV note Mycolog cream sent to [facility] on file. Patient is agreeable and verbalizes understanding.  Routing to provider for final review. Patient agreeable to disposition. Patient aware provider will review message and nurse will return call with any additional instructions or change of disposition. Will close encounter.

## 2014-12-11 ENCOUNTER — Telehealth: Payer: Self-pay | Admitting: Obstetrics & Gynecology

## 2014-12-11 NOTE — Telephone Encounter (Signed)
Message left to return call to Gryphon Vanderveen at [phone].    

## 2014-12-11 NOTE — Telephone Encounter (Signed)
Patient called to cancel appointment due to transportation issues. She is scheduled with Dr Sabra Heck for a 2 week recheck. Unable to find appointment for patient. Patient agreeable to return call from triage nurse for scheduling.

## 2014-12-12 ENCOUNTER — Ambulatory Visit: Payer: 59 | Admitting: Obstetrics & Gynecology

## 2014-12-18 NOTE — Telephone Encounter (Signed)
Patient rescheduled to 12/26/14.

## 2014-12-25 ENCOUNTER — Encounter: Payer: Self-pay | Admitting: Certified Nurse Midwife

## 2014-12-26 ENCOUNTER — Encounter: Payer: Self-pay | Admitting: Obstetrics & Gynecology

## 2014-12-26 ENCOUNTER — Ambulatory Visit (INDEPENDENT_AMBULATORY_CARE_PROVIDER_SITE_OTHER): Payer: 59 | Admitting: Obstetrics & Gynecology

## 2014-12-26 ENCOUNTER — Other Ambulatory Visit: Payer: 59

## 2014-12-26 ENCOUNTER — Telehealth: Payer: Self-pay | Admitting: Emergency Medicine

## 2014-12-26 VITALS — BP 126/82 | HR 64 | Resp 16 | Wt 250.0 lb

## 2014-12-26 DIAGNOSIS — N760 Acute vaginitis: Secondary | ICD-10-CM

## 2014-12-26 DIAGNOSIS — E668 Other obesity: Secondary | ICD-10-CM | POA: Insufficient documentation

## 2014-12-26 DIAGNOSIS — N762 Acute vulvitis: Secondary | ICD-10-CM

## 2014-12-26 DIAGNOSIS — K620 Anal polyp: Secondary | ICD-10-CM

## 2014-12-26 MED ORDER — ESTROGENS, CONJUGATED 0.625 MG/GM VA CREA: TOPICAL_CREAM | VAGINAL | Status: AC

## 2014-12-26 NOTE — Telephone Encounter (Signed)
Dr. Sabra Heck,  When scheduling patient for referral, patient states she forgot to ask you for a prescription. She is requesting a prescription for Ambien or "something to help me sleep." She states she has used Ambien in the past " a long time ago" and it worked well for her. Patient does not have PCP at this time. Has  "cannot sleep" on problem list in EPIC record.  Advised would send you request and return call. Patient agreeable.

## 2014-12-26 NOTE — Progress Notes (Signed)
Patient scheduled for consult appointment with Dr. [name] at [facility] for 01/22/15 at 0830,. Patient given written appointment information and instructions.

## 2014-12-26 NOTE — Telephone Encounter (Signed)
Patient says she forgot to ask for a prescription at her appointment today and Olivia Mackie was going to ask Dr.Miller about the prescription. Patient is going to her pharmacy soon and hoped Olivia Mackie would have an answer for her.

## 2014-12-26 NOTE — Progress Notes (Signed)
Subjective:     Patient ID: Melanie Greer, female   DOB: 04/21/1951, 64 y.o.   MRN: [id number]  HPI 75 G7 P2A5 MWF here for complaint of vaginal stinging/irritation that has been present since April.  Pt is not having any obvious discharge.  Denies urinary symptoms.  No dysuria or hematuria.  No changes with bowel movements.  No bleeding.  Pt reports she does have a rectal "polyp" that bleeds on occasion and she would like this removed today if that is possible.  S/p TAH/BSO due to fibroids.  Not on HRT.   Review of Systems  All other systems reviewed and are negative.      Objective:   Physical Exam  Constitutional: She is oriented to person, place, and time. She appears well-developed and well-nourished.  Genitourinary: Vagina normal. Rectal exam shows mass (consistent with polyp).    There is no rash, tenderness, lesion or injury on the right labia. There is no rash, tenderness, lesion or injury on the left labia.  Uterus and cervix absent.  Lymphadenopathy:       Right: No inguinal adenopathy present.       Left: No inguinal adenopathy present.  Neurological: She is alert and oriented to person, place, and time.  Skin: Skin is warm and dry.  Psychiatric: She has a normal mood and affect.   Wet smear:  Ph 5.0, KOH: neg whiff, neg yeast.  Saline:  Atrophic epithelial cells, neg trich.    Assessment:     Atrophic vaginitis/vulvitis Anal polyp Significant difficulty for pt to relax with exam     Plan:     Premarin vaginal cream 1/2 gram pv twice weekly and small amt externally.  Pt to give update in two weeks.  Can stop and use prn once symptoms resolved.  (No hx of DVT or issues with estrogens) Referral to Dr. Marcello Moores at [facility].  Doubtful pt will be able to tolerate in office removal due to difficulty pt relaxing.  (Kept inching up table and tightening legs with examination.)

## 2014-12-29 ENCOUNTER — Other Ambulatory Visit: Payer: Self-pay | Admitting: Obstetrics & Gynecology

## 2014-12-29 MED ORDER — ZOLPIDEM TARTRATE 5 MG PO TABS: 5.0000 mg | ORAL_TABLET | Freq: Every evening | ORAL | Status: DC | PRN

## 2014-12-29 NOTE — Telephone Encounter (Signed)
Patient calling about pescription. Notified patient that Melanie Greer is waiting on a response from Dr Sabra Heck and then she will call her with an answer.

## 2014-12-29 NOTE — Telephone Encounter (Signed)
Ok to try ambien 5mg  qhs.  Should not use higher dosage.  Risk of sleep walking possible so needs to let us know if has any issues with this.  Rx printed and to you for faxing.

## 2014-12-30 ENCOUNTER — Telehealth: Payer: Self-pay | Admitting: Obstetrics & Gynecology

## 2014-12-30 NOTE — Telephone Encounter (Signed)
Message left to return call to [place] at [phone].   Prior message regarding prescription ambien for patient. See prior telephone encounter.

## 2014-12-30 NOTE — Telephone Encounter (Signed)
Detailed message left okay per designated party release form. Message from Dr. Sabra Heck given. Advised to return call with any issues or concerns. Patient states prior she has used Ambien and it worked well.  Advised if any questions to return call.  Routing to provider for final review. Patient agreeable to disposition. Will close encounter.

## 2014-12-30 NOTE — Telephone Encounter (Signed)
Patient says she is returning a call to nurse. °

## 2015-01-22 ENCOUNTER — Other Ambulatory Visit: Payer: Self-pay | Admitting: General Surgery

## 2015-01-22 NOTE — H&P (Signed)
History of Present Illness Melanie Ruff MD; 09/21/4678 9:17 AM) The patient is a 64 year old female who presents with a complaint of Rectal bleeding. 64 year old female who presents to the office with an anal skin tag as well as rectal bleeding. She was seen by Dr. [name] who recommended surgical evaluation. She states that she has had this skin tag for several years but has gotten larger and more difficult to keep clean over the past few years. She states that she is also having rectal bleeding, which started 3-4 months ago. She does not get much fiber in her diet. She has occasional constipation. Her last colonoscopy was approximately 8 years ago. She states that several polyps were found. She also stated that she thought she was not due for another colonoscopy until 2018.   Other Problems Melanie Greer, CMA; 01/22/2015 8:52 AM) Arthritis Sleep Apnea  Past Surgical History Melanie Greer, CMA; 01/22/2015 8:52 AM) Cesarean Section - Multiple Hysterectomy (not due to cancer) - Complete  Diagnostic Studies History Melanie Greer, CMA; 01/22/2015 8:52 AM) Colonoscopy 5-10 years ago Mammogram 1-3 years ago Pap Smear 1-5 years ago  Allergies Melanie Greer, CMA; 01/22/2015 8:52 AM) No Known Drug Allergies07/01/2015  Medication History Melanie Greer, CMA; 01/22/2015 8:53 AM) Zolpidem Tartrate (5MG  Tablet, Oral) Active. Premarin (0.625MG /GM Cream, Vaginal) Active. Vitamin D (Ergocalciferol) (50000UNIT Capsule, Oral) Active. Nystatin (100000 UNIT/GM Ointment, External) Active. Triamcinolone Acetonide (0.1% Ointment, External) Active. Medications Reconciled  Social History Melanie Greer, [place]; 01/22/2015 8:52 AM) Alcohol use Occasional alcohol use. Caffeine use Coffee. No drug use Tobacco use Never smoker.  Family History Melanie Greer, [place]; 01/22/2015 8:52 AM) Alcohol Abuse Father. Cancer Father. Family history unknown First Degree Relatives  Pregnancy / Birth History  Melanie Greer, CMA; 01/22/2015 8:52 AM) Age at menarche 17 years. Gravida 4 Maternal age 36-25 Para 2  Review of Systems Melanie Greer CMA; 01/22/2015 8:52 AM) General Present- Weight Gain. Not Present- Appetite Loss, Chills, Fatigue, Fever, Night Sweats and Weight Loss. Skin Present- Dryness. Not Present- Change in Wart/Mole, Hives, Jaundice, New Lesions, Non-Healing Wounds, Rash and Ulcer. HEENT Present- Wears glasses/contact lenses. Not Present- Earache, Hearing Loss, Hoarseness, Nose Bleed, Oral Ulcers, Ringing in the Ears, Seasonal Allergies, Sinus Pain, Sore Throat, Visual Disturbances and Yellow Eyes. Respiratory Present- Snoring. Not Present- Bloody sputum, Chronic Cough, Difficulty Breathing and Wheezing. Breast Present- Breast Pain. Not Present- Breast Mass, Nipple Discharge and Skin Changes. Cardiovascular Present- Difficulty Breathing Lying Down and Shortness of Breath. Not Present- Chest Pain, Leg Cramps, Palpitations, Rapid Heart Rate and Swelling of Extremities. Gastrointestinal Not Present- Abdominal Pain, Bloating, Bloody Stool, Change in Bowel Habits, Chronic diarrhea, Constipation, Difficulty Swallowing, Excessive gas, Gets full quickly at meals, Hemorrhoids, Indigestion, Nausea, Rectal Pain and Vomiting. Musculoskeletal Present- Swelling of Extremities. Not Present- Back Pain, Joint Pain, Joint Stiffness, Muscle Pain and Muscle Weakness. Neurological Present- Decreased Memory. Not Present- Fainting, Headaches, Numbness, Seizures, Tingling, Tremor, Trouble walking and Weakness. Endocrine Present- Excessive Hunger. Not Present- Cold Intolerance, Hair Changes, Heat Intolerance, Hot flashes and New Diabetes.   Vitals Melanie Greer CMA; 01/22/2015 8:53 AM) 01/22/2015 8:53 AM Weight: 252 lb Height: 62in Body Surface Area: 2.24 m Body Mass Index: 46.09 kg/m Temp.: 97.39F(Oral)  Pulse: 69 (Regular)  BP: 130/63 (Sitting, Left Arm, Standard)    Physical Exam Melanie Ruff MD; 09/17/1222 9:13 AM) General Mental Status-Alert. General Appearance-Consistent with stated age. Hydration-Well hydrated. Voice-Normal.  Head and Neck Head-normocephalic, atraumatic with no lesions or palpable masses. Trachea-midline. Thyroid Gland Characteristics -  normal size and consistency.  Eye Eyeball - Bilateral-Extraocular movements intact. Sclera/Conjunctiva - Bilateral-No scleral icterus.  Chest and Lung Exam Chest and lung exam reveals -quiet, even and easy respiratory effort with no use of accessory muscles and on auscultation, normal breath sounds, no adventitious sounds and normal vocal resonance. Inspection Chest Wall - Normal. Back - normal.  Cardiovascular Cardiovascular examination reveals -normal heart sounds, regular rate and rhythm with no murmurs and normal pedal pulses bilaterally.  Abdomen Inspection Inspection of the abdomen reveals - No Hernias. Palpation/Percussion Palpation and Percussion of the abdomen reveal - Soft, Non Tender, No Rebound tenderness, No Rigidity (guarding) and No hepatosplenomegaly. Auscultation Auscultation of the abdomen reveals - Bowel sounds normal.  Rectal Anorectal Exam External - Note: Small posterior anal skin tag noted externally. Internal - normal internal exam.  Neurologic Neurologic evaluation reveals -alert and oriented x 3 with no impairment of recent or remote memory. Mental Status-Normal.  Musculoskeletal Global Assessment -Note: no gross deformities.  Normal Exam - Left-Upper Extremity Strength Normal and Lower Extremity Strength Normal. Normal Exam - Right-Upper Extremity Strength Normal and Lower Extremity Strength Normal.    Assessment & Plan Melanie Ruff MD; 01/19/9162 9:18 AM) RECTAL BLEEDING (569.3  K62.5) Impression: 64 year old female who presents to the office with a anal skin tag/external polyp as well as rectal bleeding. Her last colonoscopy was  approximately 8 years ago in [place]. She states that she has rectal bleeding several times a week with bowel movements. Her last colonoscopy per patient showed a few polyps that were removed. We will try to obtain these results and evaluate if another colonoscopy is warranted. I think that with the rectal bleeding, we should probably go ahead and do another one, but I will await these results before making any final decisions.  Addendum:I reviewed the patient's colonoscopy report by Dr. Allen Norris.  This was performed in 2008 and showed only 2 hyperplastic polyps.  Given the patient's rectal bleeding, I would like to proceed with colonoscopy.  The patient does not wish to return to elements.  She would like for me to do the colonoscopy and skin tag removal together.  We discussed the risk and benefits of these procedures.  Risk of colonoscopy include bleeding and perforation.  I believe she understands these risks and has agreed to proceed with surgery.   Impression: As far as the anal polyp is concerned, it is causing her a lot of discomfort and difficulty with hygiene. This can be excised in the office or in the operating room. Pt prefers anesthesia for this. We discussed that the area will be sore and painful for several weeks as it heals.

## 2015-02-10 ENCOUNTER — Other Ambulatory Visit: Payer: Self-pay | Admitting: Obstetrics & Gynecology

## 2015-02-11 NOTE — Telephone Encounter (Signed)
Medication refill request: Ambien 5 MG Last AEX:  11-21-14  Next AEX: 02-27-16  Last MMG (if hormonal medication request): 11-19-14 WNL  Refill authorized: please advise

## 2015-02-24 ENCOUNTER — Telehealth: Payer: Self-pay | Admitting: Certified Nurse Midwife

## 2015-02-24 NOTE — Telephone Encounter (Signed)
Routed to [employer], cnm

## 2015-02-24 NOTE — Telephone Encounter (Signed)
-----   Message from [place], [place] sent at 02/24/2015 12:51 PM EDT ----- Pt needs lab visit. Pt cancelled & needs to reschedules

## 2015-02-24 NOTE — Telephone Encounter (Signed)
Can you put this on your radar to make sure she schedules?

## 2015-02-24 NOTE — Telephone Encounter (Signed)
Patient canceled her 3 mo lab recheck 02/27/15. Patient will call to reschedule.

## 2015-02-24 NOTE — Telephone Encounter (Signed)
Reviewing patients previous labs, it looks like patient never came back in to have liver profile,tsh, lipid panel recheck. Pt aware to fast to have these done along with her vitamin d recheck. Please take a look at orders & add what needs to be added. Lab appt is 03-17-15 at 9:15 am Please close encounter when done

## 2015-02-24 NOTE — Telephone Encounter (Signed)
Will check with pt to reschedule

## 2015-02-25 ENCOUNTER — Other Ambulatory Visit: Payer: Self-pay | Admitting: Certified Nurse Midwife

## 2015-02-26 ENCOUNTER — Other Ambulatory Visit: Payer: Self-pay | Admitting: Certified Nurse Midwife

## 2015-02-26 DIAGNOSIS — R899 Unspecified abnormal finding in specimens from other organs, systems and tissues: Secondary | ICD-10-CM

## 2015-02-27 ENCOUNTER — Other Ambulatory Visit: Payer: 59

## 2015-03-10 ENCOUNTER — Telehealth: Payer: Self-pay | Admitting: Obstetrics & Gynecology

## 2015-03-10 DIAGNOSIS — Z87898 Personal history of other specified conditions: Secondary | ICD-10-CM

## 2015-03-10 DIAGNOSIS — Z8669 Personal history of other diseases of the nervous system and sense organs: Secondary | ICD-10-CM

## 2015-03-10 NOTE — Telephone Encounter (Signed)
Medication Renewal Request  Message [id number]   From  [name]   To  Megan Salon, MD   Sent  03/10/2015 9:56 AM     Original authorizing provider: Lyman Speller, MD   Melanie Greer would like a refill of the following medications:  zolpidem (AMBIEN) 5 MG tablet [MILLER, Satira Anis, MD]   Preferred pharmacy: [id number] - [place], [place]   Comment:      Responsible Party    Pool - [facility] Clinical Pool No one has taken responsibility for this message.     No actions have been taken on this message.

## 2015-03-10 NOTE — Telephone Encounter (Signed)
Please call pt.  Is she using this nightly or more frequently?  The rx came early this month so cannot be done at this time anyway but she may need to see neurologist regarding sleep due to amt of medication she is taking.  We do not recommend pt's taking this nightly but it looks like she may be taking it more than that.  Please confirm.  Thanks.

## 2015-03-10 NOTE — Telephone Encounter (Signed)
Spoke with patient. Patient states that she is only taking the medication as needed. States she will go days without needed the medication and then will need to multiple days in a row to sleep. Has 10 pills left in her current bottle. States she was requesting the medication in advance so that she would not run out. Advised we are unable to fill this before 30 days since last rx was written which is not until 8/28. Patient states that she would like to see a specialist regarding her difficultly sleeping. "I do not want to have to take this medicine any more. I want to be able to just sleep." Advised I will speak with Dr.Miller regarding referral for patient for further evaluation. Patient is agreeable.  Dr.Miller, okay to place referral to neurologist at this time?

## 2015-03-10 NOTE — Telephone Encounter (Signed)
Dr.Miller, patient sent mychart request for refill of Ambien. Ambien 5 mg 1 tablet qhs as needed for sleep was last given on 02/12/2015 at #30 0RF. NGYN on 12/25/2014 with Regina Eck CNM. Seen by you for follow up on 12/26/2014.

## 2015-03-11 NOTE — Telephone Encounter (Signed)
Please forward to Dr. Sabra Heck.

## 2015-03-12 MED ORDER — ZOLPIDEM TARTRATE 5 MG PO TABS: 5.0000 mg | ORAL_TABLET | Freq: Every evening | ORAL | Status: DC | PRN

## 2015-03-12 NOTE — Telephone Encounter (Signed)
Spoke with patient. Patient is calling to check on status of call. Patient is asking if she can have a refill of Ambien 5 mg until she is able to be seen with "sleep specialist." Advised will need to speak with Dr.Miller regarding referral and medication refill and return call. Patient is agreeable.

## 2015-03-12 NOTE — Telephone Encounter (Signed)
RF is done and will be faxed.  Ok to do referral to neurology--any provider.

## 2015-03-12 NOTE — Telephone Encounter (Signed)
Spoke with patient. Advised of message as seen below from [place]. Patient is agreeable and verbalizes understanding. Advised referral has been placed to [facility] and she will be contacted by our referrals coordinator or their office directly to set up appointment date and time. Patient is agreeable. Advised this can take a few days to complete and should hear back next week regarding appointment. Patient is agreeable.  Routing to provider for final review. Patient agreeable to disposition. Will close encounter.

## 2015-03-12 NOTE — Telephone Encounter (Signed)
Patient is calling to check on status of call. Advised I have not been able to speak with Dr.Miller regarding refill and referral. Advised refill can not be sent in until 8/28. Patient is agreeable and states "I am just trying to get ahead of everything." Advised I will speak with Dr.Miller and return call regarding referral. Patient is agreeable.

## 2015-03-17 ENCOUNTER — Other Ambulatory Visit: Payer: Self-pay | Admitting: Certified Nurse Midwife

## 2015-03-17 ENCOUNTER — Other Ambulatory Visit (INDEPENDENT_AMBULATORY_CARE_PROVIDER_SITE_OTHER): Payer: 59

## 2015-03-17 DIAGNOSIS — R899 Unspecified abnormal finding in specimens from other organs, systems and tissues: Secondary | ICD-10-CM

## 2015-03-17 LAB — COMPREHENSIVE METABOLIC PANEL
ALBUMIN: 4 g/dL (ref 3.6–5.1)
ALK PHOS: 111 U/L (ref 33–130)
ALT: 17 U/L (ref 6–29)
AST: 16 U/L (ref 10–35)
BUN: 16 mg/dL (ref 7–25)
CO2: 22 mmol/L (ref 20–31)
CREATININE: 1 mg/dL — AB (ref 0.50–0.99)
Calcium: 9.7 mg/dL (ref 8.6–10.4)
Chloride: 105 mmol/L (ref 98–110)
Glucose, Bld: 91 mg/dL (ref 65–99)
Potassium: 4.3 mmol/L (ref 3.5–5.3)
Sodium: 140 mmol/L (ref 135–146)
TOTAL PROTEIN: 6.3 g/dL (ref 6.1–8.1)
Total Bilirubin: 0.4 mg/dL (ref 0.2–1.2)

## 2015-03-17 LAB — LIPID PANEL
CHOLESTEROL: 200 mg/dL (ref 125–200)
HDL: 57 mg/dL (ref >=46)
LDL Cholesterol: 111 mg/dL (ref <130)
TRIGLYCERIDES: 161 mg/dL — AB (ref <150)
Total CHOL/HDL Ratio: 3.5 Ratio (ref <=5.0)
VLDL: 32 mg/dL — ABNORMAL HIGH (ref <30)

## 2015-03-17 LAB — TSH: TSH: 3.611 u[IU]/mL (ref 0.350–4.500)

## 2015-03-17 LAB — VITAMIN D 25 HYDROXY (VIT D DEFICIENCY, FRACTURES): Vit D, 25-Hydroxy: 18 ng/mL — ABNORMAL LOW (ref 30–100)

## 2015-03-18 LAB — HEMOGLOBIN A1C
HEMOGLOBIN A1C: 5.8 % — AB (ref <5.7)
Mean Plasma Glucose: 120 mg/dL — ABNORMAL HIGH (ref <117)

## 2015-03-19 ENCOUNTER — Telehealth: Payer: Self-pay | Admitting: Emergency Medicine

## 2015-03-19 ENCOUNTER — Other Ambulatory Visit: Payer: Self-pay | Admitting: Certified Nurse Midwife

## 2015-03-19 DIAGNOSIS — E559 Vitamin D deficiency, unspecified: Secondary | ICD-10-CM

## 2015-03-19 DIAGNOSIS — R899 Unspecified abnormal finding in specimens from other organs, systems and tissues: Secondary | ICD-10-CM

## 2015-03-19 MED ORDER — VITAMIN D (ERGOCALCIFEROL) 1.25 MG (50000 UNIT) PO CAPS: 50000.0000 [IU] | ORAL_CAPSULE | ORAL | Status: AC

## 2015-03-19 NOTE — Telephone Encounter (Signed)
Called patient and discussed messages for lab results.  Patient verbalized understanding and agreeable to follow up. Reports she does not eat meat. Will work on dietary and fitness changes.  She states she has had a hard time finding a primary care physician and would like referral. Referral made to Dr. Candiss Norse at [facility] in [place]. New patient appointment scheduled for 05/12/15 at 1000 arrive at 0950 and bring new patient paperwork which will be mailed. Will fax records for visit.   Vitamin D as ordered sent to to [facility].   Patient scheduled for one week lab recheck and two month lab recheck.   Routing to provider for final review. Patient agreeable to disposition. Will close encounter.

## 2015-03-19 NOTE — Telephone Encounter (Signed)
Notes Recorded by Regina Eck, CNM on 03/19/2015 at 7:58 AM Notify patient that her creatinine level is slightly high, but all other kidney, liver values normal Lipid panel improrved, but triglycerides still elevated normal 150, yours 161 Vitamin D is better from 13 to 18

## 2015-03-19 NOTE — Telephone Encounter (Signed)
Another 3 months and then recheck

## 2015-03-19 NOTE — Telephone Encounter (Signed)
Melanie Greer does patient need to continue with Vitamin D 50,000 international units? If so, for how long?

## 2015-03-19 NOTE — Telephone Encounter (Signed)
Notes Recorded by Regina Eck, CNM on 03/19/2015 at 8:02 AM See previous note TSH is normal now, but Hgb A1-C is elevated which indicates increased risk of developing diabetes. Needs to have creatinine rechecked , avoid any OTC medications and excessive protein. Recheck one week Need to recheck triglycerides/Hgb A1-c in two months decrease fried, sugary foods and work on exercise. If she has PCP she can choose to follow up there and will need to advise. If not she needs to have recheck as above. Orders in

## 2015-03-27 ENCOUNTER — Telehealth: Payer: Self-pay | Admitting: Certified Nurse Midwife

## 2015-03-27 ENCOUNTER — Other Ambulatory Visit: Payer: 59

## 2015-03-27 NOTE — Telephone Encounter (Signed)
Called patient to reschedule missed lab appointment from today. Says there was a death in her family and she will call back to reschedule.

## 2015-03-27 NOTE — Telephone Encounter (Signed)
Joy will you follow up on this

## 2015-04-01 NOTE — Telephone Encounter (Signed)
Pt has appt already scheduled for 11/16 here to have fasting labs done. Is it okay for patient to wait until then to have all labs checked?

## 2015-04-01 NOTE — Telephone Encounter (Signed)
yes

## 2015-04-14 ENCOUNTER — Institutional Professional Consult (permissible substitution): Payer: 59 | Admitting: Neurology

## 2015-04-17 NOTE — Telephone Encounter (Signed)
She did not come in for labs

## 2015-04-17 NOTE — Telephone Encounter (Signed)
Okay to close encounter?  °

## 2015-04-17 NOTE — Telephone Encounter (Signed)
Pt did come in for labs on 03-17-15. After those results pt had to schedule labwork again for 11/16. Pt has that appt scheduled. Encounter closed.

## 2015-05-05 ENCOUNTER — Institutional Professional Consult (permissible substitution): Payer: 59 | Admitting: Neurology

## 2015-05-27 ENCOUNTER — Telehealth: Payer: Self-pay | Admitting: Obstetrics & Gynecology

## 2015-05-27 NOTE — Telephone Encounter (Signed)
Patient canceled her appointment for 05/29/15 for labs.. She states she will call back at a later time to get this rescheduled.

## 2015-05-29 ENCOUNTER — Other Ambulatory Visit: Payer: 59

## 2015-06-04 NOTE — Telephone Encounter (Signed)
This encounter was created in error - please disregard.

## 2015-06-08 ENCOUNTER — Institutional Professional Consult (permissible substitution): Payer: 59 | Admitting: Neurology

## 2015-06-10 ENCOUNTER — Telehealth: Payer: Self-pay | Admitting: Obstetrics & Gynecology

## 2015-06-10 ENCOUNTER — Other Ambulatory Visit: Payer: Self-pay | Admitting: Obstetrics & Gynecology

## 2015-06-10 MED ORDER — ZOLPIDEM TARTRATE 5 MG PO TABS: 5.0000 mg | ORAL_TABLET | Freq: Every evening | ORAL | Status: DC | PRN

## 2015-06-10 NOTE — Telephone Encounter (Signed)
Left message to call [place] at [phone].  Patient is requesting refill of Ambien 5 mg take one tablet at bedtime as needed for sleep. Was last given #30 0RF on 03/12/2015 by Dr.Miller. Appears patient was referred to neurologist as well. Per EPIC note patient cancelled the appointment she had scheduled for a sleep consult.

## 2015-06-10 NOTE — Telephone Encounter (Signed)
Patient is asking for a prescription for Ambien. Confirmed pharmacy with patient. Last seen 03/17/15.

## 2015-06-10 NOTE — Telephone Encounter (Signed)
Spoke with patient. Patient states she had to cancel her appointment to see a neurologist for a sleep consult because her family only has one car and she was unable to get to the appointment on the day it was scheduled. Is rescheduled for 11/29. Is requesting a refill on her Ambien 5 mg until she can be seen on Tuesday. Advised per last discussion patient was to see neurology and Ambien was refilled until she could be seen. Advised may not be able to refill this rx again. Advised I will check with Dr.Miller and return call. Patient is agreeable.

## 2015-06-10 NOTE — Telephone Encounter (Signed)
Spoke with patient. Advised of message as seen below from [place]. Patient is agreeable. Rx faxed to [facility] on file at [phone] with cover sheet and confirmation.  Routing to provider for final review. Patient agreeable to disposition. Will close encounter.

## 2015-06-10 NOTE — Telephone Encounter (Signed)
Pt had last RF 8/16.  RF for #30/0RF done.  I still would encourage her to have the additional evaluation when she can.  OK to fax RF to pharmacy.

## 2015-09-09 ENCOUNTER — Other Ambulatory Visit: Payer: Self-pay | Admitting: Obstetrics & Gynecology

## 2015-09-09 NOTE — Telephone Encounter (Signed)
Patient requesting refill of Ambien to [facility] on [address]. Best # to reach: [phone]

## 2015-09-09 NOTE — Telephone Encounter (Signed)
Medication refill request: Ambien Last AEX: 11-21-14  Last Visit: 12-26-14  Next AEX: 11-24-15 Last MMG (if hormonal medication request): 11-19-14 WNL  Refill authorized: please advise

## 2015-09-10 MED ORDER — ZOLPIDEM TARTRATE 5 MG PO TABS: 5.0000 mg | ORAL_TABLET | Freq: Every evening | ORAL | Status: DC | PRN

## 2015-09-10 NOTE — Telephone Encounter (Signed)
Patient called to check on the status of the call. 

## 2015-09-10 NOTE — Telephone Encounter (Signed)
Rx faxed today. Lm for pt re: Rx sent to pharmacy.

## 2015-10-07 ENCOUNTER — Other Ambulatory Visit: Payer: Self-pay | Admitting: Certified Nurse Midwife

## 2015-10-07 NOTE — Telephone Encounter (Signed)
Medication refill request: Ambien 5 mg  Last AEX:  11/21/2015 with DL Next AEX: 11/24/2015 with DL  Last MMG (if hormonal medication request): N/A Refill authorized: #30/1 rfs

## 2015-10-07 NOTE — Telephone Encounter (Signed)
Patient calling requesting refills on Ambien be sent to her pharmacy on file.

## 2015-10-08 MED ORDER — ZOLPIDEM TARTRATE 5 MG PO TABS: 5.0000 mg | ORAL_TABLET | Freq: Every evening | ORAL | Status: AC | PRN

## 2015-10-08 NOTE — Telephone Encounter (Addendum)
Megan Salon, MD  Elroy Channel, CMA            Request for Lorrin Mais came from pharmacy. Pt was referred to neurology. She never went. This is last RF until she is seen and recommendations are made. Please document in phone note. Thanks.     Called pt. Notified Rx was faxed today.  Informed pt of Dr. Sabra Heck message. Patient states she does not have money, or car and that's why she has not been able to go.  Advised pt of Dr. Ammie Ferrier recommendations. Verbalized understanding. Confirmed with pt, she still has neurologist information if she decides to make an appt.  Dr. Lestine Box Encounter closed.

## 2015-11-24 ENCOUNTER — Ambulatory Visit: Payer: 59 | Admitting: Certified Nurse Midwife
